# Patient Record
Sex: Male | Born: 1990 | Race: White | Hispanic: No | Marital: Single | Smoking: Current every day smoker
Health system: Southern US, Community
[De-identification: ages and names within clinical notes are randomized; demographics above are authoritative.]

## PROBLEM LIST (undated history)

## (undated) DIAGNOSIS — J45909 Unspecified asthma, uncomplicated: Secondary | ICD-10-CM

## (undated) DIAGNOSIS — A6920 Lyme disease, unspecified: Secondary | ICD-10-CM

## (undated) DIAGNOSIS — N200 Calculus of kidney: Secondary | ICD-10-CM

## (undated) HISTORY — PX: APPENDECTOMY: SHX54

---

## 2017-11-02 ENCOUNTER — Emergency Department (HOSPITAL_COMMUNITY)
Admission: EM | Admit: 2017-11-02 | Discharge: 2017-11-02 | Disposition: A | Discharge status: Home / Self Care | Payer: Self-pay | Attending: Emergency Medicine | Admitting: Emergency Medicine

## 2017-11-02 ENCOUNTER — Other Ambulatory Visit: Payer: Self-pay

## 2017-11-02 ENCOUNTER — Emergency Department (HOSPITAL_COMMUNITY): Payer: Self-pay

## 2017-11-02 ENCOUNTER — Encounter (HOSPITAL_COMMUNITY): Payer: Self-pay | Admitting: Emergency Medicine

## 2017-11-02 DIAGNOSIS — N201 Calculus of ureter: Secondary | ICD-10-CM | POA: Insufficient documentation

## 2017-11-02 DIAGNOSIS — Z79899 Other long term (current) drug therapy: Secondary | ICD-10-CM | POA: Insufficient documentation

## 2017-11-02 DIAGNOSIS — F17210 Nicotine dependence, cigarettes, uncomplicated: Secondary | ICD-10-CM | POA: Insufficient documentation

## 2017-11-02 HISTORY — DX: Lyme disease, unspecified: A69.20

## 2017-11-02 HISTORY — DX: Calculus of kidney: N20.0

## 2017-11-02 LAB — URINALYSIS, ROUTINE W REFLEX MICROSCOPIC
BILIRUBIN URINE: NEGATIVE
Bacteria, UA: NONE SEEN
GLUCOSE, UA: NEGATIVE mg/dL
KETONES UR: NEGATIVE mg/dL
LEUKOCYTES UA: NEGATIVE
NITRITE: NEGATIVE
PH: 6 (ref 5.0–8.0)
Protein, ur: NEGATIVE mg/dL
SPECIFIC GRAVITY, URINE: 1.011 (ref 1.005–1.030)

## 2017-11-02 LAB — BASIC METABOLIC PANEL
Anion gap: 10 (ref 5–15)
BUN: 9 mg/dL (ref 6–20)
CALCIUM: 9.8 mg/dL (ref 8.9–10.3)
CHLORIDE: 106 mmol/L (ref 98–111)
CO2: 24 mmol/L (ref 22–32)
CREATININE: 1 mg/dL (ref 0.61–1.24)
GFR calc Af Amer: >60 mL/min (ref >60)
GFR calc non Af Amer: >60 mL/min (ref >60)
GLUCOSE: 102 mg/dL — AB (ref 70–99)
Potassium: 3.9 mmol/L (ref 3.5–5.1)
Sodium: 140 mmol/L (ref 135–145)

## 2017-11-02 LAB — CBC
HCT: 46.2 % (ref 39.0–52.0)
HEMOGLOBIN: 15.7 g/dL (ref 13.0–17.0)
MCH: 30.3 pg (ref 26.0–34.0)
MCHC: 34 g/dL (ref 30.0–36.0)
MCV: 89 fL (ref 78.0–100.0)
PLATELETS: 238 10*3/uL (ref 150–400)
RBC: 5.19 MIL/uL (ref 4.22–5.81)
RDW: 12.3 % (ref 11.5–15.5)
WBC: 13.6 10*3/uL — ABNORMAL HIGH (ref 4.0–10.5)

## 2017-11-02 MED ORDER — TAMSULOSIN HCL 0.4 MG PO CAPS: 0.4000 mg | ORAL_CAPSULE | Freq: Every day | ORAL | 0 refills | Status: AC

## 2017-11-02 MED ORDER — OXYCODONE-ACETAMINOPHEN 5-325 MG PO TABS
1.0000 | ORAL_TABLET | Freq: Once | ORAL | Status: AC
  Administered 2017-11-02: 1 via ORAL
  Filled 2017-11-02 (×2): qty 1

## 2017-11-02 MED ORDER — TRAMADOL HCL 50 MG PO TABS: 50.0000 mg | ORAL_TABLET | Freq: Four times a day (QID) | ORAL | 0 refills | Status: DC | PRN

## 2017-11-02 MED ORDER — ONDANSETRON 4 MG PO TBDP: 4.0000 mg | ORAL_TABLET | Freq: Three times a day (TID) | ORAL | 0 refills | Status: DC | PRN

## 2017-11-02 NOTE — Discharge Instructions (Signed)
You were seen in the emergency department for left flank and abdominal pain.  CT scan confirms a 5 mm stone at the junction of the left ureter and bladder, it looks like this is almost passed.  Kidney function and other labs are okay.  For  mild to moderate pain you can take 500 to 1000 mg of acetaminophen (Tylenol) and/or 600 mg of ibuprofen (Advil, Motrin) every 6-8 hours.  For more severe breakthrough pain you can take tramadol as prescribed.  Zofran for nausea.  Flomax typically helps dilate the ureter to help pass stones.  We recommend straining your urine until your symptoms resolve in hopes to collect stone. This stone can be tested by urology.   Return to ER for worsening pain, vomiting, fevers, chills, difficulty voiding urine

## 2017-11-02 NOTE — Medical Student Note (Signed)
[facility]-EMERGENCY DEPT Provider Student Note For educational purposes for Medical, PA and NP students only and not part of the legal medical record.   CSN: [id number] Arrival date & time: 11/02/17  1622     History   Chief Complaint Chief Complaint  Patient presents with  . Flank Pain    HPI Marvin Henry is a 27 y.o. male that presents for left sided flank pain that started suddenly today while he was in his car. Patient reports pain radiating down to his groin. Pain was initially 10/10 in severity. He urinated while in the ED and noted it was painful to urinate, however he felt relief following urination. Patient reports he is still having flank pain, but it is no 4/10 in severity. He denies any fevers, chills, nausea, vomiting, diarrhea, penile discharge, or swelling of penis or scrotum. Patient has history of kidney stones in the past. He reports the sensation is similar to what he experienced before.   HPI  Past Medical History:  Diagnosis Date  . Lyme disease   . Nephrolithiasis     There are no active problems to display for this patient.   Past Surgical History:  Procedure Laterality Date  . APPENDECTOMY         Home Medications    Prior to Admission medications   Medication Sig Start Date End Date Taking? Authorizing Provider  VENTOLIN HFA 108 (90 Base) MCG/ACT inhaler Inhale 2 puffs into the lungs every 4 (four) hours as needed for wheezing. 08/20/17  Yes [provider]    Family History No family history on file.  Social History Social History   Tobacco Use  . Smoking status: Current Every Day Smoker    Packs/day: 1.00    Types: Cigarettes  . Smokeless tobacco: Never Used  Substance Use Topics  . Alcohol use: Yes    Comment: occ  . Drug use: Never    Comment: CBD oil     Allergies   Sulfamethoxazole   Review of Systems Review of Systems  Constitutional: Negative for chills and fever.  HENT: Negative.   Eyes: Negative.     Respiratory: Negative for shortness of breath.   Cardiovascular: Negative for chest pain and palpitations.  Gastrointestinal: Negative for constipation, diarrhea, nausea and vomiting.  Genitourinary: Positive for flank pain. Negative for discharge, hematuria, penile swelling and scrotal swelling.  Neurological: Negative.   Hematological: Negative.   Psychiatric/Behavioral: Negative.      Physical Exam Updated Vital Signs BP 121/86 (BP Location: Left Arm)   Pulse 65   Temp 98.5 F (36.9 C) (Oral)   Resp 18   Ht 5\' 7"  (1.702 m)   Wt 66.7 kg   SpO2 100%   BMI 23.02 kg/m   Physical Exam  Constitutional: He is oriented to person, place, and time. He appears well-developed and well-nourished. No distress.  HENT:  Head: Normocephalic and atraumatic.  Eyes: Pupils are equal, round, and reactive to light. EOM are normal.  Neck: Normal range of motion. Neck supple.  Cardiovascular: Normal rate, regular rhythm, normal heart sounds and intact distal pulses.  Pulmonary/Chest: Effort normal and breath sounds normal.  Abdominal: Soft. Bowel sounds are normal. He exhibits no distension and no mass. There is tenderness (mild tenderness to palpation left side). There is no rebound and no guarding.  Musculoskeletal:       Thoracic back: He exhibits tenderness (mild tenderness left flank).  Neurological: He is alert and oriented to person, place, and time.  Skin: Skin is warm and dry.  Psychiatric: He has a normal mood and affect.     ED Treatments / Results  Labs (all labs ordered are listed, but only abnormal results are displayed) Labs Reviewed  URINALYSIS, ROUTINE W REFLEX MICROSCOPIC - Abnormal; Notable for the following components:      Result Value   Hgb urine dipstick LARGE (*)    All other components within normal limits  BASIC METABOLIC PANEL - Abnormal; Notable for the following components:   Glucose, Bld 102 (*)    All other components within normal limits  CBC -  Abnormal; Notable for the following components:   WBC 13.6 (*)    All other components within normal limits  URINE CULTURE    EKG None Radiology Ct Renal Stone Study  Result Date: 11/02/2017 CLINICAL DATA:  27 year old male with acute LEFT flank and abdominal pain today. EXAM: CT ABDOMEN AND PELVIS WITHOUT CONTRAST TECHNIQUE: Multidetector CT imaging of the abdomen and pelvis was performed following the standard protocol without IV contrast. COMPARISON:  None. FINDINGS: Please note that parenchymal abnormalities may be missed without intravenous contrast. Lower chest: No acute abnormality. Hepatobiliary: The liver and gallbladder are unremarkable. No biliary dilatation. Pancreas: Unremarkable Spleen: Unremarkable Adrenals/Urinary Tract: A punctate distal LEFT ureteral calculus (5 mm above the LEFT UVJ) is noted. No hydronephrosis identified. Punctate nonobstructing calculi within the UPPER RIGHT kidney and UPPER LEFT kidney noted. The adrenal glands and bladder are unremarkable. Stomach/Bowel: Stomach is within normal limits. No evidence of bowel wall thickening, distention, or inflammatory changes. Vascular/Lymphatic: No significant vascular findings are present. No enlarged abdominal or pelvic lymph nodes. Reproductive: Prostate is unremarkable. Other: No ascites, focal collection or pneumoperitoneum. Musculoskeletal: No acute or significant osseous findings. IMPRESSION: 1. Punctate distal LEFT ureteral calculus (just above the LEFT UVJ) without hydronephrosis. 2. Punctate nonobstructing bilateral renal calculi Electronically Signed   By: Harmon Pier M.D.   On: 11/02/2017 21:24    Procedures Procedures (including critical care time)  Medications Ordered in ED Medications  oxyCODONE-acetaminophen (PERCOCET/ROXICET) 5-325 MG per tablet 1 tablet (1 tablet Oral Given 11/02/17 2153)     Initial Impression / Assessment and Plan / ED Course  I have reviewed the triage vital signs and the nursing  notes.  Pertinent labs & imaging results that were available during my care of the patient were reviewed by me and considered in my medical decision making (see chart for details).    Patient is a 27 year old male who presents to the Emergency Department with left flank pain. Patient reported pain with urination while in the ED and slight relief of pain following urination. Blood work ordered. Kidney function normal. Patient declined Percocet initially while in the ED.  Renal CT scan showed Punctate distal LEFT ureteral calculus (just above the LEFT UVJ) without hydronephrosis and Punctate nonobstructing bilateral renal calculi.  Patient hemodynamically stable and able to tolerate foods/medications by mouth. Patient discharged with Zofran, Flomax, and Tramadol. Patient had rash as a child after taking Sulfamethoxazole. Discussed possible allergic reaction to Flomax with sulfa allergies. Advised patient return precautions if he experienced any adverse effects when taking Flomax. Patient stated his understanding.   Patient discharged in stable condition.   Final Clinical Impressions(s) / ED Diagnoses   Final diagnoses:  Calculus of ureter    New Prescriptions Discharge Medication List as of 11/02/2017 10:08 PM    START taking these medications   Details  ondansetron (ZOFRAN ODT) 4 MG disintegrating tablet  Take 1 tablet (4 mg total) by mouth every 8 (eight) hours as needed for nausea or vomiting., Starting Mon 11/02/2017, Print    tamsulosin (FLOMAX) 0.4 MG CAPS capsule Take 1 capsule (0.4 mg total) by mouth daily for 10 days., Starting Mon 11/02/2017, Until Thu 11/12/2017, Print    traMADol (ULTRAM) 50 MG tablet Take 1 tablet (50 mg total) by mouth every 6 (six) hours as needed for up to 3 days., Starting Mon 11/02/2017, Until Thu 11/05/2017, Print

## 2017-11-02 NOTE — ED Notes (Signed)
Pt stable, ambulatory, states understanding of discharge instructions 

## 2017-11-02 NOTE — ED Provider Notes (Signed)
[facility] EMERGENCY DEPARTMENT Provider Note   CSN: [id number] Arrival date & time: 11/02/17  1622     History   Chief Complaint Chief Complaint  Patient presents with  . Flank Pain    HPI Marvin Henry is a 27 y.o. 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  Pain was sudden, severe, 10/10.  Pain radiating down to his left abdomen and left lower quadrant.  Pain felt similar to previous kidney stones.  He urinated while in the ED and thinks that he may have passed a stone, states that urination was very painful but afterwards he felt better.  However, states that he still having intermittent flank pain however not as severe, thinks he may still be passing a stone.  Thinks riding on the truck may have moved or dislodged the stone.  He denies fevers, chills, nausea, vomiting, changes in bowel movements, penile discharge, swelling of penis or scrotum.  No interventions PTA.  No alleviating or aggravating factors.  Has never required surgical intervention for kidney stones.  HPI  Past Medical History:  Diagnosis Date  . Lyme disease   . Nephrolithiasis     There are no active problems to display for this patient.   Past Surgical History:  Procedure Laterality Date  . APPENDECTOMY          Home Medications    Prior to Admission medications   Medication Sig Start Date End Date Taking? Authorizing Provider  VENTOLIN HFA 108 (90 Base) MCG/ACT inhaler Inhale 2 puffs into the lungs every 4 (four) hours as needed for wheezing. 08/20/17  Yes [provider]  ondansetron (ZOFRAN ODT) 4 MG disintegrating tablet Take 1 tablet (4 mg total) by mouth every 8 (eight) hours as needed for nausea or vomiting. 11/02/17   Liberty Handy, PA-C  tamsulosin (FLOMAX) 0.4 MG CAPS capsule Take 1 capsule (0.4 mg total) by mouth daily for 10 days. 11/02/17 11/12/17  Liberty Handy, PA-C    traMADol (ULTRAM) 50 MG tablet Take 1 tablet (50 mg total) by mouth every 6 (six) hours as needed for up to 3 days. 11/02/17 11/05/17  Liberty Handy, PA-C    Family History No family history on file.  Social History Social History   Tobacco Use  . Smoking status: Current Every Day Smoker    Packs/day: 1.00    Types: Cigarettes  . Smokeless tobacco: Never Used  Substance Use Topics  . Alcohol use: Yes    Comment: occ  . Drug use: Never    Comment: CBD oil     Allergies   Sulfamethoxazole   Review of Systems Review of Systems  Genitourinary: Positive for difficulty urinating and flank pain.  All other systems reviewed and are negative.    Physical Exam Updated Vital Signs BP 126/85   Pulse 68   Temp 98.7 F (37.1 C) (Oral)   Resp 18   Ht 5\' 7"  (1.702 m)   Wt 66.7 kg (147 lb)   SpO2 100%   BMI 23.02 kg/m   Physical Exam  Constitutional: He is oriented to person, place, and time. He appears well-developed and well-nourished. No distress.  Non toxic. Sitting in hall bed.   HENT:  Head: Normocephalic and atraumatic.  Nose: Nose normal.  Moist mucous membranes   Eyes: Pupils are equal, round, and reactive to light. Conjunctivae and EOM are normal.  Neck: Normal range of motion.  Cardiovascular: Normal rate, regular rhythm and normal heart sounds.  Pulmonary/Chest: Effort normal and breath sounds normal.  Abdominal: Soft. Bowel sounds are normal. There is tenderness.  Very mild left lower CVA tenderness.  Mild tenderness to left mid abdomen and left lower quadrant.  No guarding, rigidity, rebound.  No suprapubic tenderness.  No distention.  Active bowel sounds.  Negative Murphy's, McBurney's.  Musculoskeletal: Normal range of motion.  Neurological: He is alert and oriented to person, place, and time.  Skin: Skin is warm and dry. Capillary refill takes less than 2 seconds.  Psychiatric: He has a normal mood and affect. His behavior is normal. Judgment and  thought content normal.  Nursing note and vitals reviewed.    ED Treatments / Results  Labs (all labs ordered are listed, but only abnormal results are displayed) Labs Reviewed  URINALYSIS, ROUTINE W REFLEX MICROSCOPIC - Abnormal; Notable for the following components:      Result Value   Hgb urine dipstick LARGE (*)    All other components within normal limits  BASIC METABOLIC PANEL - Abnormal; Notable for the following components:   Glucose, Bld 102 (*)    All other components within normal limits  CBC - Abnormal; Notable for the following components:   WBC 13.6 (*)    All other components within normal limits  URINE CULTURE    EKG None  Radiology Ct Renal Stone Study  Result Date: 11/02/2017 CLINICAL DATA:  27 year old male with acute LEFT flank and abdominal pain today. EXAM: CT ABDOMEN AND PELVIS WITHOUT CONTRAST TECHNIQUE: Multidetector CT imaging of the abdomen and pelvis was performed following the standard protocol without IV contrast. COMPARISON:  None. FINDINGS: Please note that parenchymal abnormalities may be missed without intravenous contrast. Lower chest: No acute abnormality. Hepatobiliary: The liver and gallbladder are unremarkable. No biliary dilatation. Pancreas: Unremarkable Spleen: Unremarkable Adrenals/Urinary Tract: A punctate distal LEFT ureteral calculus (5 mm above the LEFT UVJ) is noted. No hydronephrosis identified. Punctate nonobstructing calculi within the UPPER RIGHT kidney and UPPER LEFT kidney noted. The adrenal glands and bladder are unremarkable. Stomach/Bowel: Stomach is within normal limits. No evidence of bowel wall thickening, distention, or inflammatory changes. Vascular/Lymphatic: No significant vascular findings are present. No enlarged abdominal or pelvic lymph nodes. Reproductive: Prostate is unremarkable. Other: No ascites, focal collection or pneumoperitoneum. Musculoskeletal: No acute or significant osseous findings. IMPRESSION: 1. Punctate  distal LEFT ureteral calculus (just above the LEFT UVJ) without hydronephrosis. 2. Punctate nonobstructing bilateral renal calculi Electronically Signed   By: Harmon PierJeffrey  Hu M.D.   On: 11/02/2017 21:24    Procedures Procedures (including critical care time)  Medications Ordered in ED Medications  oxyCODONE-acetaminophen (PERCOCET/ROXICET) 5-325 MG per tablet 1 tablet (1 tablet Oral Given 11/02/17 2153)     Initial Impression / Assessment and Plan / ED Course  I have reviewed the triage vital signs and the nursing notes.  Pertinent labs & imaging results that were available during my care of the patient were reviewed by me and considered in my medical decision making (see chart for details).    Differential diagnosis includes kidney or ureteral stone.  Less likely left-sided diverticulitis as he has no fevers, chills, nausea, vomiting, diarrhea or history of the same.  Less likely testicular torsion.  Urine with hematuria but no signs of infection.  CT renal shows 5 mm distal ureter stone above the UVJ this is likely worse symptoms are initiating from.  He may have also  passed a stone previously.  Will discharge with symptomatic management and Flomax.  He does have documented allergy of rash with sulfa, I called pharmacist in regards to this and they think cross-reactivity with Flomax is very low risk.  Patient never had angioedema with sulfa during childhood.  I discussed potential cross-reactivity with Flomax and provided instructions on how to take it and return precautions.  He verbalized understanding.  Final Clinical Impressions(s) / ED Diagnoses   Final diagnoses:  Calculus of ureter    ED Discharge Orders        Ordered    traMADol (ULTRAM) 50 MG tablet  Every 6 hours PRN     11/02/17 2155    ondansetron (ZOFRAN ODT) 4 MG disintegrating tablet  Every 8 hours PRN     11/02/17 2155    tamsulosin (FLOMAX) 0.4 MG CAPS capsule  Daily     11/02/17 2208       Liberty Handy,  PA-C 11/03/17 1208    Arby Barrette, MD 11/05/17 2325

## 2017-11-02 NOTE — ED Notes (Signed)
Patient transported to CT 

## 2017-11-02 NOTE — ED Triage Notes (Signed)
Patient to ED c/o sudden onset L flank pain about an hour ago, cramping that radiated down L side. Patient reports urgency and then when he got to the ED, he urinated and felt some relief (reports pain was 10/10 and is now 4/10). Hx kidney stones. In no apparent distress at this time.

## 2017-11-04 ENCOUNTER — Other Ambulatory Visit: Payer: Self-pay

## 2017-11-04 ENCOUNTER — Encounter (HOSPITAL_COMMUNITY): Payer: Self-pay | Admitting: *Deleted

## 2017-11-04 ENCOUNTER — Emergency Department (HOSPITAL_COMMUNITY)
Admission: EM | Admit: 2017-11-04 | Discharge: 2017-11-04 | Disposition: A | Discharge status: Home / Self Care | Payer: 59 | Attending: Emergency Medicine | Admitting: Emergency Medicine

## 2017-11-04 DIAGNOSIS — N23 Unspecified renal colic: Secondary | ICD-10-CM | POA: Diagnosis not present

## 2017-11-04 DIAGNOSIS — R109 Unspecified abdominal pain: Secondary | ICD-10-CM | POA: Diagnosis present

## 2017-11-04 DIAGNOSIS — F17210 Nicotine dependence, cigarettes, uncomplicated: Secondary | ICD-10-CM | POA: Diagnosis not present

## 2017-11-04 MED ORDER — HYDROMORPHONE HCL 1 MG/ML IJ SOLN
1.0000 mg | Freq: Once | INTRAMUSCULAR | Status: AC
Start: 2017-11-04 — End: 2017-11-04
  Administered 2017-11-04: 1 mg via INTRAVENOUS
  Filled 2017-11-04: qty 1

## 2017-11-04 MED ORDER — KETOROLAC TROMETHAMINE 30 MG/ML IJ SOLN
30.0000 mg | Freq: Once | INTRAMUSCULAR | Status: AC
  Administered 2017-11-04: 30 mg via INTRAVENOUS
  Filled 2017-11-04: qty 1

## 2017-11-04 MED ORDER — OXYCODONE-ACETAMINOPHEN 5-325 MG PO TABS: 1.0000 | ORAL_TABLET | Freq: Once | ORAL | Status: DC

## 2017-11-04 MED ORDER — OXYCODONE-ACETAMINOPHEN 5-325 MG PO TABS: 1.0000 | ORAL_TABLET | ORAL | 0 refills | Status: DC | PRN

## 2017-11-04 MED ORDER — ONDANSETRON HCL 4 MG/2ML IJ SOLN
4.0000 mg | Freq: Once | INTRAMUSCULAR | Status: AC
  Administered 2017-11-04: 4 mg via INTRAVENOUS
  Filled 2017-11-04: qty 2

## 2017-11-04 NOTE — ED Notes (Signed)
Patient not changed into gown due to severe pain with and without movement.

## 2017-11-04 NOTE — ED Notes (Signed)
Reports 10/10 pain - skin tan, warm, clammy; appears in acute distress; recently d/x'd with kidney stones - on flomax and tramadol at home - reports pain increased acutely at 0900 this am

## 2017-11-04 NOTE — ED Notes (Signed)
MD at bedside. 

## 2017-11-04 NOTE — ED Notes (Signed)
Cranberry juice given per pt request; resting quietly on stretcher - states feeling "much better"

## 2017-11-04 NOTE — ED Triage Notes (Signed)
PT tx here on 7/1 for tx of kidney stones.  Given 150 mcg fentanyl and 4 of zofran en-route.  VS stable.  140/71, hr 80, 98% RA.  Pt appears in great pain.

## 2017-11-04 NOTE — ED Notes (Signed)
Family at bedside. 

## 2017-11-04 NOTE — ED Provider Notes (Signed)
[facility] Provider Note   CSN: [id number] Arrival date & time: 11/04/17  1154     History   Chief Complaint Chief Complaint  Patient presents with  . Flank Pain    HPI Marvin Henry is a 27 y.o. male.  HPI   Complains of left flank pain radiating to the left lower abdomen, worsening despite treatment with tramadol and Flomax.  He was diagnosed with a punctate distal left ureteral stone, 2 days ago, and was referred to urology, who is not seen yet.  No prior episodes of renal stones.  No nausea, vomiting, fever, chills, weakness or dizziness.  There are no other no modifying factors.  Past Medical History:  Diagnosis Date  . Lyme disease   . Nephrolithiasis     There are no active problems to display for this patient.   Past Surgical History:  Procedure Laterality Date  . APPENDECTOMY          Home Medications    Prior to Admission medications   Medication Sig Start Date End Date Taking? Authorizing Provider  ondansetron (ZOFRAN ODT) 4 MG disintegrating tablet Take 1 tablet (4 mg total) by mouth every 8 (eight) hours as needed for nausea or vomiting. 11/02/17   Liberty HandyGibbons, Claudia J, PA-C  oxyCODONE-acetaminophen (PERCOCET/ROXICET) 5-325 MG tablet Take 1 tablet by mouth every 4 (four) hours as needed for moderate pain or severe pain. 11/04/17   Mancel BaleWentz, Mishelle Hassan, MD  tamsulosin (FLOMAX) 0.4 MG CAPS capsule Take 1 capsule (0.4 mg total) by mouth daily for 10 days. 11/02/17 11/12/17  Liberty HandyGibbons, Claudia J, PA-C  VENTOLIN HFA 108 (90 Base) MCG/ACT inhaler Inhale 2 puffs into the lungs every 4 (four) hours as needed for wheezing. 08/20/17   [provider]    Family History No family history on file.  Social History Social History   Tobacco Use  . Smoking status: Current Every Day Smoker    Packs/day: 1.00    Types: Cigarettes  . Smokeless tobacco: Never Used  Substance Use Topics  . Alcohol use: Yes    Comment: occ  . Drug  use: Never    Comment: CBD oil     Allergies   Sulfamethoxazole   Review of Systems Review of Systems  All other systems reviewed and are negative.    Physical Exam Updated Vital Signs BP 114/62   Pulse 75   Temp 98 F (36.7 C) (Oral)   Resp 17   Ht 5\' 7"  (1.702 m)   Wt 66.7 kg (147 lb)   SpO2 95%   BMI 23.02 kg/m   Physical Exam  Constitutional: He is oriented to person, place, and time. He appears well-developed and well-nourished.  HENT:  Head: Normocephalic and atraumatic.  Right Ear: External ear normal.  Left Ear: External ear normal.  Eyes: Pupils are equal, round, and reactive to light. Conjunctivae and EOM are normal.  Neck: Normal range of motion and phonation normal. Neck supple.  Cardiovascular: Normal rate and regular rhythm.  Pulmonary/Chest: Effort normal. He exhibits no bony tenderness.  Musculoskeletal: Normal range of motion.  Neurological: He is alert and oriented to person, place, and time. No cranial nerve deficit or sensory deficit. He exhibits normal muscle tone. Coordination normal.  Skin: Skin is warm, dry and intact.  Psychiatric: He has a normal mood and affect. His behavior is normal. Judgment and thought content normal.  Nursing note and vitals reviewed.    ED Treatments / Results  Labs (  all labs ordered are listed, but only abnormal results are displayed) Labs Reviewed - No data to display  EKG None  Radiology Ct Renal Stone Study  Result Date: 11/02/2017 CLINICAL DATA:  27 year old male with acute LEFT flank and abdominal pain today. EXAM: CT ABDOMEN AND PELVIS WITHOUT CONTRAST TECHNIQUE: Multidetector CT imaging of the abdomen and pelvis was performed following the standard protocol without IV contrast. COMPARISON:  None. FINDINGS: Please note that parenchymal abnormalities may be missed without intravenous contrast. Lower chest: No acute abnormality. Hepatobiliary: The liver and gallbladder are unremarkable. No biliary  dilatation. Pancreas: Unremarkable Spleen: Unremarkable Adrenals/Urinary Tract: A punctate distal LEFT ureteral calculus (5 mm above the LEFT UVJ) is noted. No hydronephrosis identified. Punctate nonobstructing calculi within the UPPER RIGHT kidney and UPPER LEFT kidney noted. The adrenal glands and bladder are unremarkable. Stomach/Bowel: Stomach is within normal limits. No evidence of bowel wall thickening, distention, or inflammatory changes. Vascular/Lymphatic: No significant vascular findings are present. No enlarged abdominal or pelvic lymph nodes. Reproductive: Prostate is unremarkable. Other: No ascites, focal collection or pneumoperitoneum. Musculoskeletal: No acute or significant osseous findings. IMPRESSION: 1. Punctate distal LEFT ureteral calculus (just above the LEFT UVJ) without hydronephrosis. 2. Punctate nonobstructing bilateral renal calculi Electronically Signed   By: Harmon Pier M.D.   On: 11/02/2017 21:24    Procedures Procedures (including critical care time)  Medications Ordered in ED Medications  oxyCODONE-acetaminophen (PERCOCET/ROXICET) 5-325 MG per tablet 1 tablet (has no administration in time range)  HYDROmorphone (DILAUDID) injection 1 mg (1 mg Intravenous Given 11/04/17 1226)  ketorolac (TORADOL) 30 MG/ML injection 30 mg (30 mg Intravenous Given 11/04/17 1229)  ondansetron (ZOFRAN) injection 4 mg (4 mg Intravenous Given 11/04/17 1228)     Initial Impression / Assessment and Plan / ED Course  I have reviewed the triage vital signs and the nursing notes.  Pertinent labs & imaging results that were available during my care of the patient were reviewed by me and considered in my medical decision making (see chart for details).     Patient Vitals for the past 24 hrs:  BP Temp Temp src Pulse Resp SpO2 Height Weight  11/04/17 1300 114/62 - - 75 17 95 % - -  11/04/17 1230 120/78 - - 73 - 99 % - -  11/04/17 1207 - - - - - - 5\' 7"  (1.702 m) 66.7 kg (147 lb)  11/04/17 1204  117/73 98 F (36.7 C) Oral 89 (!) 22 100 % - -    2:16 PM Reevaluation with update and discussion. After initial assessment and treatment, an updated evaluation reveals states his pain was initially almost gone now is returning somewhat.  Oral Percocet ordered.  Findings discussed with patient and family members, all questions answered. Mancel Bale   Medical Decision Making: Small left distal ureteral stone, likely still present, with obstructive symptoms, without likely acute renal failure.  Doubt serious bacterial infection, metabolic instability or impending vascular collapse  CRITICAL CARE-no Performed by: Mancel Bale   Nursing Notes Reviewed/ Care Coordinated Applicable Imaging Reviewed Interpretation of Laboratory Data incorporated into ED treatment  The patient appears reasonably screened and/or stabilized for discharge and I doubt any other medical condition or other [facility] requiring further screening, evaluation, or treatment in the ED at this time prior to discharge.  Plan: Home Medications-hold tramadol for now, continue Flomax; Home Treatments-oral fluids, strain urine; return here if the recommended treatment, does not improve the symptoms; Recommended follow up-urology follow-up 1 week and as needed  Final Clinical Impressions(s) / ED Diagnoses   Final diagnoses:  Ureteral colic    ED Discharge Orders        Ordered    oxyCODONE-acetaminophen (PERCOCET/ROXICET) 5-325 MG tablet  Every 4 hours PRN     11/04/17 1419       Mancel Bale, MD 11/04/17 1419

## 2017-11-08 ENCOUNTER — Emergency Department (HOSPITAL_COMMUNITY)
Admission: EM | Admit: 2017-11-08 | Discharge: 2017-11-08 | Disposition: A | Discharge status: Home / Self Care | Payer: 59 | Attending: Emergency Medicine | Admitting: Emergency Medicine

## 2017-11-08 ENCOUNTER — Emergency Department (HOSPITAL_COMMUNITY): Payer: 59

## 2017-11-08 ENCOUNTER — Encounter (HOSPITAL_COMMUNITY): Payer: Self-pay | Admitting: Emergency Medicine

## 2017-11-08 ENCOUNTER — Emergency Department (HOSPITAL_COMMUNITY): Payer: 59 | Admitting: Anesthesiology

## 2017-11-08 ENCOUNTER — Encounter (HOSPITAL_COMMUNITY)
Admission: EM | Disposition: A | Discharge status: Home / Self Care | Payer: Self-pay | Source: Home / Self Care | Attending: Emergency Medicine

## 2017-11-08 DIAGNOSIS — N201 Calculus of ureter: Secondary | ICD-10-CM | POA: Insufficient documentation

## 2017-11-08 DIAGNOSIS — F17210 Nicotine dependence, cigarettes, uncomplicated: Secondary | ICD-10-CM | POA: Diagnosis not present

## 2017-11-08 DIAGNOSIS — N135 Crossing vessel and stricture of ureter without hydronephrosis: Secondary | ICD-10-CM | POA: Insufficient documentation

## 2017-11-08 DIAGNOSIS — Z79899 Other long term (current) drug therapy: Secondary | ICD-10-CM | POA: Insufficient documentation

## 2017-11-08 HISTORY — PX: CYSTOSCOPY WITH RETROGRADE PYELOGRAM, URETEROSCOPY AND STENT PLACEMENT: SHX5789

## 2017-11-08 LAB — URINALYSIS, ROUTINE W REFLEX MICROSCOPIC
BACTERIA UA: NONE SEEN
BILIRUBIN URINE: NEGATIVE
Glucose, UA: NEGATIVE mg/dL
Ketones, ur: 80 mg/dL — AB
LEUKOCYTES UA: NEGATIVE
Nitrite: NEGATIVE
PH: 8 (ref 5.0–8.0)
Protein, ur: NEGATIVE mg/dL
RBC / HPF: >50 RBC/hpf — ABNORMAL HIGH (ref 0–5)
Specific Gravity, Urine: 1.019 (ref 1.005–1.030)

## 2017-11-08 LAB — BASIC METABOLIC PANEL
ANION GAP: 12 (ref 5–15)
BUN: 11 mg/dL (ref 6–20)
CO2: 20 mmol/L — AB (ref 22–32)
Calcium: 9.1 mg/dL (ref 8.9–10.3)
Chloride: 108 mmol/L (ref 98–111)
Creatinine, Ser: 1.43 mg/dL — ABNORMAL HIGH (ref 0.61–1.24)
GFR calc Af Amer: >60 mL/min (ref >60)
GFR calc non Af Amer: >60 mL/min (ref >60)
GLUCOSE: 102 mg/dL — AB (ref 70–99)
POTASSIUM: 3.8 mmol/L (ref 3.5–5.1)
Sodium: 140 mmol/L (ref 135–145)

## 2017-11-08 LAB — CBC
HEMATOCRIT: 41.7 % (ref 39.0–52.0)
Hemoglobin: 14.2 g/dL (ref 13.0–17.0)
MCH: 30.1 pg (ref 26.0–34.0)
MCHC: 34.1 g/dL (ref 30.0–36.0)
MCV: 88.3 fL (ref 78.0–100.0)
Platelets: 232 10*3/uL (ref 150–400)
RBC: 4.72 MIL/uL (ref 4.22–5.81)
RDW: 12.2 % (ref 11.5–15.5)
WBC: 18.3 10*3/uL — AB (ref 4.0–10.5)

## 2017-11-08 SURGERY — CYSTOURETEROSCOPY, WITH RETROGRADE PYELOGRAM AND STENT INSERTION
Anesthesia: General | Site: Ureter | Laterality: Bilateral

## 2017-11-08 MED ORDER — OXYCODONE-ACETAMINOPHEN 5-325 MG PO TABS
1.0000 | ORAL_TABLET | Freq: Once | ORAL | Status: AC
  Administered 2017-11-08: 1 via ORAL
  Filled 2017-11-08: qty 1

## 2017-11-08 MED ORDER — OXYCODONE-ACETAMINOPHEN 5-325 MG PO TABS: 1.0000 | ORAL_TABLET | ORAL | 0 refills | Status: DC | PRN

## 2017-11-08 MED ORDER — SODIUM CHLORIDE 0.9 % IR SOLN
Status: DC | PRN
  Administered 2017-11-08: 1000 mL via INTRAVESICAL
  Administered 2017-11-08: 3000 mL via INTRAVESICAL

## 2017-11-08 MED ORDER — FENTANYL CITRATE (PF) 100 MCG/2ML IJ SOLN
INTRAMUSCULAR | Status: AC
  Filled 2017-11-08: qty 2

## 2017-11-08 MED ORDER — SUCCINYLCHOLINE CHLORIDE 200 MG/10ML IV SOSY
PREFILLED_SYRINGE | INTRAVENOUS | Status: DC | PRN
  Administered 2017-11-08: 100 mg via INTRAVENOUS

## 2017-11-08 MED ORDER — ROCURONIUM BROMIDE 100 MG/10ML IV SOLN
INTRAVENOUS | Status: AC
  Filled 2017-11-08: qty 1

## 2017-11-08 MED ORDER — SODIUM CHLORIDE 0.9 % IV BOLUS: 1000.0000 mL | Freq: Once | INTRAVENOUS | Status: DC

## 2017-11-08 MED ORDER — CEFAZOLIN SODIUM-DEXTROSE 2-3 GM-%(50ML) IV SOLR
INTRAVENOUS | Status: DC | PRN
  Administered 2017-11-08: 2 g via INTRAVENOUS

## 2017-11-08 MED ORDER — ONDANSETRON HCL 4 MG/2ML IJ SOLN
4.0000 mg | Freq: Once | INTRAMUSCULAR | Status: AC
  Administered 2017-11-08: 4 mg via INTRAVENOUS
  Filled 2017-11-08: qty 2

## 2017-11-08 MED ORDER — MIDAZOLAM HCL 5 MG/5ML IJ SOLN
INTRAMUSCULAR | Status: DC | PRN
  Administered 2017-11-08: 2 mg via INTRAVENOUS

## 2017-11-08 MED ORDER — HYDROMORPHONE HCL 1 MG/ML IJ SOLN
0.5000 mg | Freq: Once | INTRAMUSCULAR | Status: AC
  Administered 2017-11-08: 0.5 mg via INTRAVENOUS
  Filled 2017-11-08: qty 1

## 2017-11-08 MED ORDER — ONDANSETRON HCL 4 MG/2ML IJ SOLN
4.00 | INTRAMUSCULAR | Status: DC
Start: ? — End: 2017-11-08

## 2017-11-08 MED ORDER — BELLADONNA ALKALOIDS-OPIUM 16.2-30 MG RE SUPP
RECTAL | Status: AC
  Filled 2017-11-08: qty 1

## 2017-11-08 MED ORDER — DEXAMETHASONE SODIUM PHOSPHATE 4 MG/ML IJ SOLN
INTRAMUSCULAR | Status: DC | PRN
  Administered 2017-11-08: 10 mg via INTRAVENOUS

## 2017-11-08 MED ORDER — PROMETHAZINE HCL 25 MG/ML IJ SOLN: 6.2500 mg | INTRAMUSCULAR | Status: DC | PRN

## 2017-11-08 MED ORDER — LIDOCAINE 2% (20 MG/ML) 5 ML SYRINGE
INTRAMUSCULAR | Status: DC | PRN
  Administered 2017-11-08: 60 mg via INTRAVENOUS

## 2017-11-08 MED ORDER — LIDOCAINE HCL URETHRAL/MUCOSAL 2 % EX GEL
CUTANEOUS | Status: DC | PRN
  Administered 2017-11-08: 1 via URETHRAL

## 2017-11-08 MED ORDER — CEFAZOLIN SODIUM-DEXTROSE 2-4 GM/100ML-% IV SOLN
2.0000 g | INTRAVENOUS | Status: AC
  Administered 2017-11-08: 2 g via INTRAVENOUS

## 2017-11-08 MED ORDER — PROPOFOL 10 MG/ML IV BOLUS
INTRAVENOUS | Status: AC
  Filled 2017-11-08: qty 20

## 2017-11-08 MED ORDER — SUCCINYLCHOLINE CHLORIDE 200 MG/10ML IV SOSY
PREFILLED_SYRINGE | INTRAVENOUS | Status: AC
  Filled 2017-11-08: qty 10

## 2017-11-08 MED ORDER — ROCURONIUM BROMIDE 10 MG/ML (PF) SYRINGE
PREFILLED_SYRINGE | INTRAVENOUS | Status: DC | PRN
  Administered 2017-11-08: 40 mg via INTRAVENOUS

## 2017-11-08 MED ORDER — PROPOFOL 10 MG/ML IV BOLUS
INTRAVENOUS | Status: DC | PRN
  Administered 2017-11-08: 150 mg via INTRAVENOUS

## 2017-11-08 MED ORDER — ONDANSETRON HCL 4 MG/2ML IJ SOLN
INTRAMUSCULAR | Status: DC | PRN
  Administered 2017-11-08: 4 mg via INTRAVENOUS

## 2017-11-08 MED ORDER — HYDROMORPHONE HCL 1 MG/ML IJ SOLN
1.00 | INTRAMUSCULAR | Status: DC
Start: ? — End: 2017-11-08

## 2017-11-08 MED ORDER — FENTANYL CITRATE (PF) 100 MCG/2ML IJ SOLN
INTRAMUSCULAR | Status: DC | PRN
  Administered 2017-11-08 (×2): 50 ug via INTRAVENOUS

## 2017-11-08 MED ORDER — SUGAMMADEX SODIUM 200 MG/2ML IV SOLN
INTRAVENOUS | Status: DC | PRN
  Administered 2017-11-08: 200 mg via INTRAVENOUS

## 2017-11-08 MED ORDER — PHENAZOPYRIDINE HCL 200 MG PO TABS: 200.0000 mg | ORAL_TABLET | Freq: Three times a day (TID) | ORAL | 1 refills | Status: AC | PRN

## 2017-11-08 MED ORDER — ACETAMINOPHEN 10 MG/ML IV SOLN
INTRAVENOUS | Status: AC
  Filled 2017-11-08: qty 100

## 2017-11-08 MED ORDER — LIDOCAINE 2% (20 MG/ML) 5 ML SYRINGE
INTRAMUSCULAR | Status: AC
  Filled 2017-11-08: qty 5

## 2017-11-08 MED ORDER — KETOROLAC TROMETHAMINE 30 MG/ML IJ SOLN
30.0000 mg | Freq: Once | INTRAMUSCULAR | Status: AC
  Administered 2017-11-08: 30 mg via INTRAVENOUS
  Filled 2017-11-08: qty 1

## 2017-11-08 MED ORDER — LIDOCAINE HCL URETHRAL/MUCOSAL 2 % EX GEL
CUTANEOUS | Status: AC
  Filled 2017-11-08: qty 5

## 2017-11-08 MED ORDER — FENTANYL CITRATE (PF) 100 MCG/2ML IJ SOLN: 25.0000 ug | INTRAMUSCULAR | Status: DC | PRN

## 2017-11-08 MED ORDER — SODIUM CHLORIDE 0.9 % IV SOLN
INTRAVENOUS | Status: DC
  Administered 2017-11-08: 12:00:00 via INTRAVENOUS

## 2017-11-08 MED ORDER — BELLADONNA ALKALOIDS-OPIUM 16.2-60 MG RE SUPP
RECTAL | Status: DC | PRN
  Administered 2017-11-08: 1 via RECTAL

## 2017-11-08 MED ORDER — ACETAMINOPHEN 10 MG/ML IV SOLN
1000.0000 mg | Freq: Four times a day (QID) | INTRAVENOUS | Status: DC
  Filled 2017-11-08 (×2): qty 100

## 2017-11-08 MED ORDER — MIDAZOLAM HCL 2 MG/2ML IJ SOLN
INTRAMUSCULAR | Status: AC
  Filled 2017-11-08: qty 2

## 2017-11-08 MED ORDER — SODIUM CHLORIDE 0.9 % IV BOLUS
1000.0000 mL | Freq: Once | INTRAVENOUS | Status: AC
  Administered 2017-11-08: 1000 mL via INTRAVENOUS

## 2017-11-08 SURGICAL SUPPLY — 25 items
BAG URO CATCHER STRL LF (MISCELLANEOUS) ×4 IMPLANT
BASKET STONE NCOMPASS (UROLOGICAL SUPPLIES) IMPLANT
CATH INTERMIT  6FR 70CM (CATHETERS) ×4 IMPLANT
CATH URET 5FR 28IN OPEN ENDED (CATHETERS) ×4 IMPLANT
CATH URET DUAL LUMEN 6-10FR 50 (CATHETERS) ×4 IMPLANT
CLOTH BEACON ORANGE TIMEOUT ST (SAFETY) ×4 IMPLANT
COVER FOOTSWITCH UNIV (MISCELLANEOUS) ×4 IMPLANT
COVER SURGICAL LIGHT HANDLE (MISCELLANEOUS) ×4 IMPLANT
EXTRACTOR STONE NITINOL NGAGE (UROLOGICAL SUPPLIES) ×4 IMPLANT
FIBER LASER FLEXIVA 1000 (UROLOGICAL SUPPLIES) IMPLANT
FIBER LASER FLEXIVA 365 (UROLOGICAL SUPPLIES) IMPLANT
FIBER LASER FLEXIVA 550 (UROLOGICAL SUPPLIES) IMPLANT
FIBER LASER TRAC TIP (UROLOGICAL SUPPLIES) ×4 IMPLANT
GLOVE SURG SS PI 8.0 STRL IVOR (GLOVE) ×4 IMPLANT
GOWN STRL REUS W/TWL XL LVL3 (GOWN DISPOSABLE) ×4 IMPLANT
GUIDEWIRE STR DUAL SENSOR (WIRE) ×8 IMPLANT
IV NS 1000ML (IV SOLUTION) ×2
IV NS 1000ML BAXH (IV SOLUTION) ×2 IMPLANT
IV NS IRRIG 3000ML ARTHROMATIC (IV SOLUTION) ×4 IMPLANT
MANIFOLD NEPTUNE II (INSTRUMENTS) ×4 IMPLANT
PACK CYSTO (CUSTOM PROCEDURE TRAY) ×4 IMPLANT
SHEATH URETERAL 12FRX35CM (MISCELLANEOUS) ×4 IMPLANT
TUBING CONNECTING 10 (TUBING) ×3 IMPLANT
TUBING CONNECTING 10' (TUBING) ×1
TUBING UROLOGY SET (TUBING) ×4 IMPLANT

## 2017-11-08 NOTE — H&P (View-Only) (Signed)
Subjective: CC: right flank pain.  Hx: Marvin Henry is a 27 yo WM who I was asked to see in consultation by Rhea Bleacher PA at [facility] ER for ureteral stones and intractable pain.   Had the onset on 7/1 of left flank pain and came to [facility] ER where a CT showed a small left distal stone.   He has subsequently been back to [facility] ER on 7/3 and at [facility] ER on 7/6 with recurrent pain, but yesterday the pain had moved to the right side and a repeat CT on 7/6 showed bilateral small distal stones about 1-12mm in size.  He has small bilateral renal stones as well.  He has had severe nausea and vomiting but no voiding complaints.  He passed a stone about 4 years ago but has no other associated signs or symptoms.   His UA has >50 RBC's.   ROS:  Review of Systems  Constitutional: Negative for chills and fever.  Cardiovascular: Positive for chest pain (following prolonged vomiting.).  Gastrointestinal: Positive for abdominal pain, nausea and vomiting.  Genitourinary: Positive for flank pain.  All other systems reviewed and are negative.   Allergies  Allergen Reactions  . Sulfamethoxazole Rash    Past Medical History:  Diagnosis Date  . Lyme disease   . Nephrolithiasis     Past Surgical History:  Procedure Laterality Date  . APPENDECTOMY      Social History   Socioeconomic History  . Marital status: Single    Spouse name: Not on file  . Number of children: Not on file  . Years of education: Not on file  . Highest education level: Not on file  Occupational History  . Not on file  Social Needs  . Financial resource strain: Not on file  . Food insecurity:    Worry: Not on file    Inability: Not on file  . Transportation needs:    Medical: Not on file    Non-medical: Not on file  Tobacco Use  . Smoking status: Current Every Day Smoker    Packs/day: 1.00    Types: Cigarettes  . Smokeless tobacco: Never Used  Substance and Sexual Activity  . Alcohol use: Yes    Comment: occ  .  Drug use: Yes    Types: Marijuana    Comment: CBD oil  . Sexual activity: Not on file  Lifestyle  . Physical activity:    Days per week: Not on file    Minutes per session: Not on file  . Stress: Not on file  Relationships  . Social connections:    Talks on phone: Not on file    Gets together: Not on file    Attends religious service: Not on file    Active member of club or organization: Not on file    Attends meetings of clubs or organizations: Not on file    Relationship status: Not on file  . Intimate partner violence:    Fear of current or ex partner: Not on file    Emotionally abused: Not on file    Physically abused: Not on file    Forced sexual activity: Not on file  Other Topics Concern  . Not on file  Social History Narrative  . Not on file    History reviewed. No pertinent family history.  Anti-infectives: Anti-infectives (From admission, onward)   None      Current Facility-Administered Medications  Medication Dose Route Frequency Provider Last Rate Last Dose  .  acetaminophen (OFIRMEV) IV 1,000 mg  1,000 mg Intravenous Q6H Renne CriglerGeiple, Joshua, PA-C      . HYDROmorphone (DILAUDID) injection 0.5 mg  0.5 mg Intravenous Once Renne CriglerGeiple, Joshua, PA-C      . sodium chloride 0.9 % bolus 1,000 mL  1,000 mL Intravenous Once Renne CriglerGeiple, Joshua, PA-C       Current Outpatient Medications  Medication Sig Dispense Refill  . ondansetron (ZOFRAN ODT) 4 MG disintegrating tablet Take 1 tablet (4 mg total) by mouth every 8 (eight) hours as needed for nausea or vomiting. 20 tablet 0  . oxyCODONE-acetaminophen (PERCOCET/ROXICET) 5-325 MG tablet Take 1 tablet by mouth every 4 (four) hours as needed for moderate pain or severe pain. 20 tablet 0  . tamsulosin (FLOMAX) 0.4 MG CAPS capsule Take 1 capsule (0.4 mg total) by mouth daily for 10 days. 10 capsule 0  . VENTOLIN HFA 108 (90 Base) MCG/ACT inhaler Inhale 2 puffs into the lungs every 4 (four) hours as needed for wheezing.  0      Objective: Vital signs in last 24 hours: Temp:  [98 F (36.7 C)] 98 F (36.7 C) (07/07 0629) Pulse Rate:  [95-105] 95 (07/07 0705) Resp:  [20-22] 22 (07/07 0705) BP: (108-114)/(44-81) 114/44 (07/07 0705) SpO2:  [98 %-100 %] 100 % (07/07 0705) Weight:  [66.7 kg (147 lb)] 66.7 kg (147 lb) (07/07 0653)  Intake/Output from previous day: No intake/output data recorded. Intake/Output this shift: No intake/output data recorded.   Physical Exam  Constitutional: He is oriented to person, place, and time. He appears well-developed and well-nourished. He appears distressed (in pain).  HENT:  Head: Normocephalic and atraumatic.  Neck: Normal range of motion. Neck supple. No thyromegaly present.  Cardiovascular: Normal rate, regular rhythm and normal heart sounds.  Pulmonary/Chest: Effort normal and breath sounds normal. No respiratory distress.  Abdominal: Soft. There is tenderness (right lower quadrant and flank tenderness.).  Musculoskeletal: Normal range of motion. He exhibits no edema or tenderness.  Lymphadenopathy:    He has no cervical adenopathy.  Neurological: He is alert and oriented to person, place, and time.  Skin: Skin is warm and dry.  Psychiatric:  Mood agitated with pain.  Affect normal.   Vitals reviewed.   Lab Results:  Recent Labs    11/08/17 0719  WBC 18.3*  HGB 14.2  HCT 41.7  PLT 232   BMET Recent Labs    11/08/17 0719  NA 140  K 3.8  CL 108  CO2 20*  GLUCOSE 102*  BUN 11  CREATININE 1.43*  CALCIUM 9.1   PT/INR No results for input(s): LABPROT, INR in the last 72 hours. ABG No results for input(s): PHART, HCO3 in the last 72 hours.  Invalid input(s): PCO2, PO2  Studies/Results: No results found.  I have reviewed his labs and UA.  I have reviewed his CT's from 7/1 and 11/07/17.  I have discussed his case with the ER PA.   Assessment: Bilateral distal ureteral stones with intractable right flank pain and nausea.   AKI with Cr of  1.43.  Small bilateral renal stones.    I discussed the options for therapy including further medical management vs blaterl ureteroscopic stone extraction.   He has elected ureteroscopy so [facility].  I reviewed the risks of bleeding, infection, ureteral injury, need for a stent or secondary procedures, thrombotic events and anesthetic complications.     CC: Rhea BleacherJosh Geiple PA     Marvin PippinJohn Ciji Henry  11/08/2017 [phone]  

## 2017-11-08 NOTE — Discharge Instructions (Signed)

## 2017-11-08 NOTE — ED Notes (Signed)
[facility] contacted to transport patient to [place] 

## 2017-11-08 NOTE — ED Provider Notes (Signed)
[facility] Provider Note   CSN: [id number] Arrival date & time: 11/08/17  [id number]     History   Chief Complaint Chief Complaint  Patient presents with  . Flank Pain    HPI Marvin Henry is a 27 y.o. male.  Patient with known bilateral ureteral stones presents with uncontrolled right-sided flank pain.  Patient has been seen several times over the past week (starting 7/1) for the same, most recently at [facility] yesterday.  Initially had L ureteral stone, then yesterday diagnosed with bilateral ureteral stones (see CT read below). Plan is for patient to follow-up with urologist on Monday @ 8:00am (tomorrow).  Patient has been taking oxycodone on a schedule at home.  He denies fevers, diarrhea.  He has had multiple episodes of vomiting at home.  Small amount of urine at 3am, did urinate a good amount last evening. He thinks he may have passed a stone last night but did not strain urine to confirm. States that when he was hospitalized, Toradol helped his symptoms.  The onset of this condition was acute. The course is intermittent. Aggravating factors: none. Alleviating factors: none.   CT 11/07/2017 @ [facility]:   IMPRESSION: 1. 1 mm stone within the distal LEFT ureter, progressed slightly in the interval, now located just proximal to the LEFT UVJ. There is now an associated mild LEFT-sided hydronephrosis. 2. New 1 mm stone within the distal RIGHT ureter, located just proximal to the RIGHT UVJ, causing mild RIGHT-sided hydronephrosis. This 1 mm stone was located in the RIGHT renal pelvis on earlier CT of 11/02/2017, passing to the distal RIGHT ureter since the previous exam.        Past Medical History:  Diagnosis Date  . Lyme disease   . Nephrolithiasis     There are no active problems to display for this patient.   Past Surgical History:  Procedure Laterality Date  . APPENDECTOMY          Home Medications      Prior to Admission medications   Medication Sig Start Date End Date Taking? Authorizing Provider  ondansetron (ZOFRAN ODT) 4 MG disintegrating tablet Take 1 tablet (4 mg total) by mouth every 8 (eight) hours as needed for nausea or vomiting. 11/02/17   Liberty HandyGibbons, Claudia J, PA-C  oxyCODONE-acetaminophen (PERCOCET/ROXICET) 5-325 MG tablet Take 1 tablet by mouth every 4 (four) hours as needed for moderate pain or severe pain. 11/04/17   Mancel BaleWentz, Elliott, MD  tamsulosin (FLOMAX) 0.4 MG CAPS capsule Take 1 capsule (0.4 mg total) by mouth daily for 10 days. 11/02/17 11/12/17  Liberty HandyGibbons, Claudia J, PA-C  VENTOLIN HFA 108 (90 Base) MCG/ACT inhaler Inhale 2 puffs into the lungs every 4 (four) hours as needed for wheezing. 08/20/17   [provider]    Family History No family history on file.  Social History Social History   Tobacco Use  . Smoking status: Current Every Day Smoker    Packs/day: 1.00    Types: Cigarettes  . Smokeless tobacco: Never Used  Substance Use Topics  . Alcohol use: Yes    Comment: occ  . Drug use: Never    Comment: CBD oil     Allergies   Sulfamethoxazole   Review of Systems Review of Systems  Constitutional: Negative for fever.  HENT: Negative for rhinorrhea and sore throat.   Eyes: Negative for redness.  Respiratory: Negative for cough.   Cardiovascular: Negative for chest pain.  Gastrointestinal: Positive  for nausea and vomiting. Negative for abdominal pain and diarrhea.  Genitourinary: Positive for flank pain. Negative for dysuria and hematuria.  Musculoskeletal: Negative for myalgias.  Skin: Negative for rash.  Neurological: Negative for headaches.     Physical Exam Updated Vital Signs BP (!) 114/44 (BP Location: Right Arm)   Pulse 95   Temp 98 F (36.7 C) (Oral)   Resp (!) 22   Ht 5\' 7"  (1.702 m)   Wt 66.7 kg (147 lb)   SpO2 100%   BMI 23.02 kg/m   Physical Exam  Constitutional: He appears well-developed and well-nourished. He appears  distressed.  HENT:  Head: Normocephalic and atraumatic.  Mouth/Throat: Oropharynx is clear and moist.  Eyes: Conjunctivae are normal. Right eye exhibits no discharge. Left eye exhibits no discharge.  Neck: Normal range of motion. Neck supple.  Cardiovascular: Normal rate, regular rhythm and normal heart sounds.  Pulmonary/Chest: Effort normal and breath sounds normal. No respiratory distress.  Abdominal: Soft. There is no tenderness. There is no rebound and no guarding.  Neurological: He is alert.  Skin: Skin is warm and dry.  Psychiatric: He has a normal mood and affect.  Nursing note and vitals reviewed.    ED Treatments / Results  Labs (all labs ordered are listed, but only abnormal results are displayed) Labs Reviewed  CBC - Abnormal; Notable for the following components:      Result Value   WBC 18.3 (*)    All other components within normal limits  BASIC METABOLIC PANEL - Abnormal; Notable for the following components:   CO2 20 (*)    Glucose, Bld 102 (*)    Creatinine, Ser 1.43 (*)    All other components within normal limits  URINALYSIS, ROUTINE W REFLEX MICROSCOPIC - Abnormal; Notable for the following components:   Hgb urine dipstick MODERATE (*)    Ketones, ur 80 (*)    RBC / HPF >50 (*)    All other components within normal limits    EKG None  Radiology No results found.  Procedures Procedures (including critical care time)  Medications Ordered in ED Medications  sodium chloride 0.9 % bolus 1,000 mL (has no administration in time range)  acetaminophen (OFIRMEV) IV 1,000 mg (has no administration in time range)  ketorolac (TORADOL) 30 MG/ML injection 30 mg (30 mg Intravenous Given 11/08/17 0727)  HYDROmorphone (DILAUDID) injection 0.5 mg (0.5 mg Intravenous Given 11/08/17 0727)  ondansetron (ZOFRAN) injection 4 mg (4 mg Intravenous Given 11/08/17 0727)  sodium chloride 0.9 % bolus 1,000 mL (0 mLs Intravenous Stopped 11/08/17 1118)  HYDROmorphone (DILAUDID)  injection 0.5 mg (0.5 mg Intravenous Given 11/08/17 0912)  oxyCODONE-acetaminophen (PERCOCET/ROXICET) 5-325 MG per tablet 1 tablet (1 tablet Oral Given 11/08/17 0912)  HYDROmorphone (DILAUDID) injection 0.5 mg (0.5 mg Intravenous Given 11/08/17 1129)     Initial Impression / Assessment and Plan / ED Course  I have reviewed the triage vital signs and the nursing notes.  Pertinent labs & imaging results that were available during my care of the patient were reviewed by me and considered in my medical decision making (see chart for details).     Patient seen and examined.  Reviewed previous records and imaging.  Work-up initiated. Medications ordered.   Vital signs reviewed and are as follows: BP (!) 114/44 (BP Location: Right Arm)   Pulse 95   Temp 98 F (36.7 C) (Oral)   Resp (!) 22   Ht 5\' 7"  (1.702 m)   Wt 66.7  kg (147 lb)   SpO2 100%   BMI 23.02 kg/m   8:35 AM Creatinine yesterday 1.03 --> 1.43 today.   Pt's pain currently controlled. Will hydrate. Will need to ensure good UOP.   10:37 AM Patient seen with Dr. Rosalia Hammers. Pt needed re-dosing of IV pain medications. Pain is currently mild to moderate. No UTI on UA.   Will discuss with urology.   10:47 AM Spoke with Dr. Annabell Howells who will see. Pt updated.   11:20 AM Dr. Annabell Howells at bedside. IV tylenol and dilaudid ordered for recurrent pain.    Final Clinical Impressions(s) / ED Diagnoses   Final diagnoses:  Bilateral ureteral calculi   Kidney stones, bilateral, intractable pain. To OR.   ED Discharge Orders    None       Renne Crigler, Cordelia Poche 11/08/17 1131    Margarita Grizzle, MD 11/08/17 1640

## 2017-11-08 NOTE — ED Notes (Signed)
Patient ambulatory to restroom to give urine sample.  

## 2017-11-08 NOTE — Anesthesia Procedure Notes (Signed)
Procedure Name: Intubation Date/Time: 11/08/2017 1:03 PM Performed by: Vanessa Durhamochran, Karesha Trzcinski Glenn, CRNA Pre-anesthesia Checklist: Patient identified, Emergency Drugs available, Suction available and Patient being monitored Patient Re-evaluated:Patient Re-evaluated prior to induction Oxygen Delivery Method: Circle system utilized Preoxygenation: Pre-oxygenation with 100% oxygen Induction Type: IV induction, Rapid sequence and Cricoid Pressure applied Laryngoscope Size: 2 and Miller Grade View: Grade I Tube type: Oral Tube size: 7.5 mm Number of attempts: 1 Airway Equipment and Method: Stylet Placement Confirmation: ETT inserted through vocal cords under direct vision,  positive ETCO2 and breath sounds checked- equal and bilateral Secured at: 22 cm Tube secured with: Tape Dental Injury: Teeth and Oropharynx as per pre-operative assessment

## 2017-11-08 NOTE — ED Notes (Signed)
[facility] arrived to transport patient.  Consent signed

## 2017-11-08 NOTE — Consult Note (Signed)
Subjective: CC: right flank pain.  Hx: Marvin Henry is a 27 yo WM who I was asked to see in consultation by Rhea Bleacher PA at [facility] ER for ureteral stones and intractable pain.   Had the onset on 7/1 of left flank pain and came to [facility] ER where a CT showed a small left distal stone.   He has subsequently been back to the [facility] ER on 7/3 and at [facility] ER on 7/6 with recurrent pain, but yesterday the pain had moved to the right side and a repeat CT on 7/6 showed bilateral small distal stones about 1-5mm in size.  He has small bilateral renal stones as well.  He has had severe nausea and vomiting but no voiding complaints.  He passed a stone about 4 years ago but has no other associated signs or symptoms.   His UA has >50 RBC's.   ROS:  Review of Systems  Constitutional: Negative for chills and fever.  Cardiovascular: Positive for chest pain (following prolonged vomiting.).  Gastrointestinal: Positive for abdominal pain, nausea and vomiting.  Genitourinary: Positive for flank pain.  All other systems reviewed and are negative.   Allergies  Allergen Reactions  . Sulfamethoxazole Rash    Past Medical History:  Diagnosis Date  . Lyme disease   . Nephrolithiasis     Past Surgical History:  Procedure Laterality Date  . APPENDECTOMY      Social History   Socioeconomic History  . Marital status: Single    Spouse name: Not on file  . Number of children: Not on file  . Years of education: Not on file  . Highest education level: Not on file  Occupational History  . Not on file  Social Needs  . Financial resource strain: Not on file  . Food insecurity:    Worry: Not on file    Inability: Not on file  . Transportation needs:    Medical: Not on file    Non-medical: Not on file  Tobacco Use  . Smoking status: Current Every Day Smoker    Packs/day: 1.00    Types: Cigarettes  . Smokeless tobacco: Never Used  Substance and Sexual Activity  . Alcohol use: Yes    Comment: occ  .  Drug use: Yes    Types: Marijuana    Comment: CBD oil  . Sexual activity: Not on file  Lifestyle  . Physical activity:    Days per week: Not on file    Minutes per session: Not on file  . Stress: Not on file  Relationships  . Social connections:    Talks on phone: Not on file    Gets together: Not on file    Attends religious service: Not on file    Active member of club or organization: Not on file    Attends meetings of clubs or organizations: Not on file    Relationship status: Not on file  . Intimate partner violence:    Fear of current or ex partner: Not on file    Emotionally abused: Not on file    Physically abused: Not on file    Forced sexual activity: Not on file  Other Topics Concern  . Not on file  Social History Narrative  . Not on file    History reviewed. No pertinent family history.  Anti-infectives: Anti-infectives (From admission, onward)   None      Current Facility-Administered Medications  Medication Dose Route Frequency Provider Last Rate Last Dose  .  acetaminophen (OFIRMEV) IV 1,000 mg  1,000 mg Intravenous Q6H Renne CriglerGeiple, Joshua, PA-C      . HYDROmorphone (DILAUDID) injection 0.5 mg  0.5 mg Intravenous Once Renne CriglerGeiple, Joshua, PA-C      . sodium chloride 0.9 % bolus 1,000 mL  1,000 mL Intravenous Once Renne CriglerGeiple, Joshua, PA-C       Current Outpatient Medications  Medication Sig Dispense Refill  . ondansetron (ZOFRAN ODT) 4 MG disintegrating tablet Take 1 tablet (4 mg total) by mouth every 8 (eight) hours as needed for nausea or vomiting. 20 tablet 0  . oxyCODONE-acetaminophen (PERCOCET/ROXICET) 5-325 MG tablet Take 1 tablet by mouth every 4 (four) hours as needed for moderate pain or severe pain. 20 tablet 0  . tamsulosin (FLOMAX) 0.4 MG CAPS capsule Take 1 capsule (0.4 mg total) by mouth daily for 10 days. 10 capsule 0  . VENTOLIN HFA 108 (90 Base) MCG/ACT inhaler Inhale 2 puffs into the lungs every 4 (four) hours as needed for wheezing.  0      Objective: Vital signs in last 24 hours: Temp:  [98 F (36.7 C)] 98 F (36.7 C) (07/07 0629) Pulse Rate:  [95-105] 95 (07/07 0705) Resp:  [20-22] 22 (07/07 0705) BP: (108-114)/(44-81) 114/44 (07/07 0705) SpO2:  [98 %-100 %] 100 % (07/07 0705) Weight:  [66.7 kg (147 lb)] 66.7 kg (147 lb) (07/07 0653)  Intake/Output from previous day: No intake/output data recorded. Intake/Output this shift: No intake/output data recorded.   Physical Exam  Constitutional: He is oriented to person, place, and time. He appears well-developed and well-nourished. He appears distressed (in pain).  HENT:  Head: Normocephalic and atraumatic.  Neck: Normal range of motion. Neck supple. No thyromegaly present.  Cardiovascular: Normal rate, regular rhythm and normal heart sounds.  Pulmonary/Chest: Effort normal and breath sounds normal. No respiratory distress.  Abdominal: Soft. There is tenderness (right lower quadrant and flank tenderness.).  Musculoskeletal: Normal range of motion. He exhibits no edema or tenderness.  Lymphadenopathy:    He has no cervical adenopathy.  Neurological: He is alert and oriented to person, place, and time.  Skin: Skin is warm and dry.  Psychiatric:  Mood agitated with pain.  Affect normal.   Vitals reviewed.   Lab Results:  Recent Labs    11/08/17 0719  WBC 18.3*  HGB 14.2  HCT 41.7  PLT 232   BMET Recent Labs    11/08/17 0719  NA 140  K 3.8  CL 108  CO2 20*  GLUCOSE 102*  BUN 11  CREATININE 1.43*  CALCIUM 9.1   PT/INR No results for input(s): LABPROT, INR in the last 72 hours. ABG No results for input(s): PHART, HCO3 in the last 72 hours.  Invalid input(s): PCO2, PO2  Studies/Results: No results found.  I have reviewed his labs and UA.  I have reviewed his CT's from 7/1 and 11/07/17.  I have discussed his case with the ER PA.   Assessment: Bilateral distal ureteral stones with intractable right flank pain and nausea.   AKI with Cr of  1.43.  Small bilateral renal stones.    I discussed the options for therapy including further medical management vs blaterl ureteroscopic stone extraction.   He has elected ureteroscopy so [facility].  I reviewed the risks of bleeding, infection, ureteral injury, need for a stent or secondary procedures, thrombotic events and anesthetic complications.     CC: Rhea BleacherJosh Geiple PA     Marvin Henry  11/08/2017 [phone]  

## 2017-11-08 NOTE — Op Note (Signed)
Procedure: 1. Cystoscopy with right ureteroscopy.  2 insertion of right double-J stent. 3.  Left ureteroscopy with holmium laser application and stent insertion.  Preop diagnosis: Bilateral distal ureteral stones.  Postop diagnosis: Same with bilateral distal strictures.  Surgeon: Dr. Bjorn PippinJohn Kenosha Henry.  Anesthesia: General.  Specimen: None.  Drains: Bilateral 4.8 French by 26 cm contour double-J stent's.  EBL: Minimal.  Complications: Minimal.  Indications: Marvin Henry is a 27 year old white male who came to the emergency room for the fourth time in a week today for ureteral calculi.  He originally presented with left flank pain and was found to have a 1 to 2 mm left distal ureteral stone on 11/02/2017, but on subsequent imaging at an ER visit on 11/07/2017 he was found to have bilateral both proximally 1 to 2 mm.  He has had intractable pain and nausea with minimal temporary relief with Toradol, hydromorphone and Tylenol.  It was felt that cystoscopy with bilateral ureteroscopy was indicated.  Procedure: He was given 2 g of Ancef.  He was taken operating room where general anesthetic was induced.  He was placed in lithotomy position and fitted with PAS hose.  Her perineum and genitalia were prepped with Betadine solution and draped in usual sterile fashion.  Cystoscopy was performed using the 23 JamaicaFrench scope and 30 degree lens.  Examination revealed a normal urethra, and intact external sphincter and a short prostatic urethra with trilobar hyperplasia but minimal obstruction.  Examination of bladder revealed normal mucosa with mild trabeculation and no tumors or stones were identified.  Ureteral orifices were in the normal anatomic position.  An initial attempt with the 4.5 French semirigid ureteroscope was then made on the right.  I was able to enter the meatus but the ureter just proximal to the meatus was extremely tight.  I then passed a sensor guidewire through the ureteroscope and advanced it to  the kidney under fluoroscopic guidance.  The ureteroscope was removed and an initial attempt to dilate the ureter with an 8 French dual-lumen catheter was made but I was unable to get the catheter beyond the very distal ureter.  I then passed a 5 [place]French opening catheter with minimal resistance followed by 6 French opening catheter over the wire with moderate resistance.  I then inserted the ureteroscope over the wire but was unable to advance it to the level of the presumed stone.  The stones were quite small and not seen on fluoroscopic evaluation.  At this point I removed the ureteroscope the left the wire in place.  I then turned my attention to the left ureter and encountered a similar situation with very tight intramural ureter that would not dilate readily with the 8 French catheter however I could pass the 6 [place]French up.  I passed a wire through the ureteroscope prior to passing the dilators.  I was able to eventually pass the ureteroscope up to the level of the stone which was tightly impacted in the intramural ureter.  I passed a 200 m laser fiber which I set on 0.5 W and 10 Hz and was able to give the stone a few shocks which jostled it loose.  But I had difficulty maintaining contact with the stone due to the angulation of the ureter and the wire within the lumen.  I removed the ureteroscope and then reinserted it over the wire and I was able to advance it above the level of the stone into the more  dilated proximal ureter.  I did not  see any stone material above the level of the intramural ureter.  I withdrew the ureteroscope and did not see any stone material and felt that it is likely passed into the bladder.  I then reinserted the guidewire to the kidney and remove the ureteroscope.  I inserted the cystoscope and inspected the bladder and could not find any residual stone fragments.  I made one more attempt on the right the past the ureteroscope over the wire without success.  At this point I  reinserted the the cystoscope over the wire to the left kidney and passed a 4.8 [place] by 26 cm contour double-J stent under fluoroscopic guidance.  The wire was removed leaving a good coil in the kidney and a good coil in the bladder.  I then reinserted the cystoscope over the right-sided wire and passed a 4.8 [place] by 26 cm contour double-J stent also under fluoroscopic guidance with good residual coiling upon removal of the wire.  No tethers were left.  At this point the bladder was drained and the cystoscope was removed.  The urethra was instilled with 10 cc of 2% lidocaine jelly which was secured with a penile clamp and a B&O suppository was placed.  He was taken down from lithotomy position, his anesthetic was reversed and he was moved to recovery room in stable condition.  There were no complications but he will require another trip to the operating room after the ureters have had a chance to self dilate over the next week or 2.

## 2017-11-08 NOTE — Transfer of Care (Signed)
Immediate Anesthesia Transfer of Care Note  Patient: Marvin Henry  Procedure(s) Performed: CYSTOSCOPY, BILATERAL URETEROSCOPY AND BILATERAL URETERAL STENT PLACEMENT (Bilateral Ureter)  Patient Location: PACU  Anesthesia Type:General  Level of Consciousness: drowsy  Airway & Oxygen Therapy: Patient Spontanous Breathing and Patient connected to face mask  Post-op Assessment: Report given to RN and Post -op Vital signs reviewed and stable  Post vital signs: Reviewed and stable  Last Vitals:  Vitals Value Taken Time  BP 118/73 11/08/2017  2:14 PM  Temp    Pulse 76 11/08/2017  2:14 PM  Resp 13 11/08/2017  2:14 PM  SpO2 100 % 11/08/2017  2:14 PM  Vitals shown include unvalidated device data.  Last Pain:  Vitals:   11/08/17 1115  TempSrc: Oral  PainSc: 10-Worst pain ever         Complications: No apparent anesthesia complications

## 2017-11-08 NOTE — ED Notes (Signed)
[facility] contacted to transport patient to [facility] 

## 2017-11-08 NOTE — Anesthesia Preprocedure Evaluation (Signed)
Anesthesia Evaluation  Patient identified by MRN, date of birth, ID band Patient awake    Reviewed: Allergy & Precautions, NPO status , Patient's Chart, lab work & pertinent test results  Airway Mallampati: II  TM Distance: >3 FB Neck ROM: Full    Dental  (+) Dental Advisory Given   Pulmonary Current Smoker,    breath sounds clear to auscultation       Cardiovascular negative cardio ROS   Rhythm:Regular Rate:Normal     Neuro/Psych negative neurological ROS     GI/Hepatic negative GI ROS, Neg liver ROS,   Endo/Other    Renal/GU Renal InsufficiencyRenal disease     Musculoskeletal   Abdominal   Peds  Hematology negative hematology ROS (+)   Anesthesia Other Findings   Reproductive/Obstetrics                             Lab Results  Component Value Date   WBC 18.3 (H) 11/08/2017   HGB 14.2 11/08/2017   HCT 41.7 11/08/2017   MCV 88.3 11/08/2017   PLT 232 11/08/2017   Lab Results  Component Value Date   CREATININE 1.43 (H) 11/08/2017   BUN 11 11/08/2017   NA 140 11/08/2017   K 3.8 11/08/2017   CL 108 11/08/2017   CO2 20 (L) 11/08/2017    Anesthesia Physical Anesthesia Plan  ASA: II and emergent  Anesthesia Plan: General   Post-op Pain Management:    Induction: Intravenous and Rapid sequence  PONV Risk Score and Plan: 2 and Ondansetron, Dexamethasone and Treatment may vary due to age or medical condition  Airway Management Planned: Oral ETT  Additional Equipment:   Intra-op Plan:   Post-operative Plan: Extubation in OR  Informed Consent: I have reviewed the patients History and Physical, chart, labs and discussed the procedure including the risks, benefits and alternatives for the proposed anesthesia with the patient or authorized representative who has indicated his/her understanding and acceptance.     Plan Discussed with:   Anesthesia Plan Comments:          Anesthesia Quick Evaluation

## 2017-11-08 NOTE — Progress Notes (Signed)
PACU NURSING DISCHARGE NOTE: pt awake, oriented x 4, mae x 4, tolerates PO fluids well, cont to deny any pain post operatively, was able to ambulate to restroom with very min assistance, gait very steady, voided w/o difficulty. DC instructions from Anesthesia team along with DC written information provided to pt. Discussed Post Procedural diet, importance of rest for next 24 hours, not to consume alcohol, drive or make important decisions as per outlined by anesthesia team. Also reviewed and discussed importance of safety while taking PO pain med as written by primary MD, also encouraged PO fluids to aid in urination, education in regards to handwashing also provided. Discussed when to cal MD as outlined on AVS instructions sheet. Pt escorted via wc to exit. Copy of AVS and the two Rx by MD given to pts mother.

## 2017-11-08 NOTE — ED Triage Notes (Addendum)
Pt transported from home by EMS for L abd/flank pain. Pt seen here x 2 for non obstructing punctate kidney stone. Per EMS pt states he took his pain meds but vomited.  IV est, Fentanyl 150mcg and Zofran 8mg  given.  On arrival pt writhing, moaning on arrival, unable to obtain BP d/t movement.  Wife at bedside states [facility], states he had another CT and the state more stones have dropped.  Pt continues to moan and shake with pain.  Pt did have some improvement with Toradol previously.

## 2017-11-09 ENCOUNTER — Encounter (HOSPITAL_COMMUNITY): Payer: Self-pay | Admitting: Urology

## 2017-11-09 NOTE — Anesthesia Postprocedure Evaluation (Signed)
Anesthesia Post Note  Patient: Paulo Fruitimothy Campa  Procedure(s) Performed: CYSTOSCOPY, BILATERAL URETEROSCOPY AND BILATERAL URETERAL STENT PLACEMENT (Bilateral Ureter)     Patient location during evaluation: PACU Anesthesia Type: General Level of consciousness: awake and alert Pain management: pain level controlled Vital Signs Assessment: post-procedure vital signs reviewed and stable Respiratory status: spontaneous breathing, nonlabored ventilation, respiratory function stable and patient connected to nasal cannula oxygen Cardiovascular status: blood pressure returned to baseline and stable Postop Assessment: no apparent nausea or vomiting Anesthetic complications: no    Last Vitals:  Vitals:   11/08/17 1440 11/08/17 1457  BP:  130/86  Pulse: 90   Resp:    Temp:  36.6 C  SpO2: 97%     Last Pain:  Vitals:   11/08/17 1457  TempSrc:   PainSc: 0-No pain                 Kennieth RadFitzgerald, Tashonda Pinkus E

## 2017-11-10 ENCOUNTER — Other Ambulatory Visit: Payer: Self-pay | Admitting: Urology

## 2017-11-10 ENCOUNTER — Telehealth: Payer: Self-pay | Admitting: Urology

## 2017-11-10 NOTE — Telephone Encounter (Signed)
Contacted by patients mother who called to report passage of two small blood clots with voiding today. Patient's urine otherwise clear. He is emptying well. No fevers/chills or nausea vomiting. Pain well controlled although does report some discomfort immediately after voiding. Reassurance provided. Advised to call back with worsening symptoms or any other concerns. She was appreciative of the call.

## 2017-11-18 NOTE — Patient Instructions (Signed)
Marvin Henry  11/18/2017   Your procedure is scheduled on: 11-24-17   [facility]    Report to admitting at 5:30AM    Call this number if you have problems the morning of surgery [phone]     Remember: Do not eat food or drink liquids :After Midnight.     Take these medicines the morning of surgery with A SIP OF WATER: tamsulosin, tramadol if needed                                You may not have any metal on your body including hair pins and              piercings  Do not wear jewelry, make-up, lotions, powders or perfumes, deodorant               Men may shave face and neck.   Do not bring valuables to the hospital. [facility] IS NOT             RESPONSIBLE   FOR VALUABLES.  Contacts, dentures or bridgework may not be worn into surgery.      Patients discharged the day of surgery will not be allowed to drive home.  Name and phone number of your driver:  Special Instructions: N/A              Please read over the following fact sheets you were given: _____________________________________________________________________             [facility] - Preparing for [facility] Before surgery, you can play an important role.  Because skin is not sterile, your skin needs to be as free of germs as possible.  You can reduce the number of germs on your skin by washing with CHG (chlorahexidine gluconate) soap before surgery.  CHG is an antiseptic cleaner which kills germs and bonds with the skin to continue killing germs even after washing. Please DO NOT use if you have an allergy to CHG or antibacterial soaps.  If your skin becomes reddened/irritated stop using the CHG and inform your nurse when you arrive at Short Stay. Do not shave (including legs and underarms) for at least 48 hours prior to the first CHG shower.  You may shave your face/neck. Please follow these instructions carefully:  1.  Shower with CHG Soap the night before  surgery and the  morning of Surgery.  2.  If you choose to wash your hair, wash your hair first as usual with your  normal  shampoo.  3.  After you shampoo, rinse your hair and body thoroughly to remove the  shampoo.                           4.  Use CHG as you would any other liquid soap.  You can apply chg directly  to the skin and wash                       Gently with a scrungie or clean washcloth.  5.  Apply the CHG Soap to your body ONLY FROM THE NECK DOWN.   Do not use on face/ open  Wound or open sores. Avoid contact with eyes, ears mouth and genitals (private parts).                       Wash face,  Genitals (private parts) with your normal soap.             6.  Wash thoroughly, paying special attention to the area where your surgery  will be performed.  7.  Thoroughly rinse your body with warm water from the neck down.  8.  DO NOT shower/wash with your normal soap after using and rinsing off  the CHG Soap.                9.  Pat yourself dry with a clean towel.            10.  Wear clean pajamas.            11.  Place clean sheets on your bed the night of your first shower and do not  sleep with pets. Day of Surgery : Do not apply any lotions/deodorants the morning of surgery.  Please wear clean clothes to the hospital/surgery center.  FAILURE TO FOLLOW THESE INSTRUCTIONS MAY RESULT IN THE CANCELLATION OF YOUR SURGERY PATIENT SIGNATURE_________________________________  NURSE SIGNATURE__________________________________  ________________________________________________________________________

## 2017-11-18 NOTE — Progress Notes (Signed)
cxr 08-20-17 [facility] epic   Cbc, bmp, ua 11-08-17 epic

## 2017-11-19 ENCOUNTER — Encounter (HOSPITAL_COMMUNITY)
Admission: RE | Admit: 2017-11-19 | Discharge: 2017-11-19 | Disposition: A | Discharge status: Home / Self Care | Payer: 59 | Source: Ambulatory Visit | Attending: Urology | Admitting: Urology

## 2017-11-19 ENCOUNTER — Other Ambulatory Visit: Payer: Self-pay

## 2017-11-19 ENCOUNTER — Encounter (HOSPITAL_COMMUNITY): Payer: Self-pay

## 2017-11-19 DIAGNOSIS — Z01812 Encounter for preprocedural laboratory examination: Secondary | ICD-10-CM | POA: Insufficient documentation

## 2017-11-19 HISTORY — DX: Unspecified asthma, uncomplicated: J45.909

## 2017-11-19 LAB — CBC
HCT: 41.4 % (ref 39.0–52.0)
HEMOGLOBIN: 14 g/dL (ref 13.0–17.0)
MCH: 30.6 pg (ref 26.0–34.0)
MCHC: 33.8 g/dL (ref 30.0–36.0)
MCV: 90.4 fL (ref 78.0–100.0)
Platelets: 356 10*3/uL (ref 150–400)
RBC: 4.58 MIL/uL (ref 4.22–5.81)
RDW: 12.6 % (ref 11.5–15.5)
WBC: 8 10*3/uL (ref 4.0–10.5)

## 2017-11-24 ENCOUNTER — Other Ambulatory Visit: Payer: Self-pay

## 2017-11-24 ENCOUNTER — Ambulatory Visit (HOSPITAL_COMMUNITY): Payer: 59 | Admitting: Certified Registered Nurse Anesthetist

## 2017-11-24 ENCOUNTER — Encounter (HOSPITAL_COMMUNITY): Payer: Self-pay | Admitting: *Deleted

## 2017-11-24 ENCOUNTER — Ambulatory Visit (HOSPITAL_COMMUNITY): Payer: 59

## 2017-11-24 ENCOUNTER — Ambulatory Visit (HOSPITAL_COMMUNITY)
Admission: RE | Admit: 2017-11-24 | Discharge: 2017-11-24 | Disposition: A | Discharge status: Home / Self Care | Payer: 59 | Source: Ambulatory Visit | Attending: Urology | Admitting: Urology

## 2017-11-24 ENCOUNTER — Encounter (HOSPITAL_COMMUNITY)
Admission: RE | Disposition: A | Discharge status: Home / Self Care | Payer: Self-pay | Source: Ambulatory Visit | Attending: Urology

## 2017-11-24 DIAGNOSIS — F17210 Nicotine dependence, cigarettes, uncomplicated: Secondary | ICD-10-CM | POA: Diagnosis not present

## 2017-11-24 DIAGNOSIS — N202 Calculus of kidney with calculus of ureter: Secondary | ICD-10-CM | POA: Insufficient documentation

## 2017-11-24 HISTORY — PX: CYSTOSCOPY WITH URETEROSCOPY AND STENT PLACEMENT: SHX6377

## 2017-11-24 LAB — BASIC METABOLIC PANEL
Anion gap: 9 (ref 5–15)
BUN: 13 mg/dL (ref 6–20)
CO2: 26 mmol/L (ref 22–32)
Calcium: 9.1 mg/dL (ref 8.9–10.3)
Chloride: 106 mmol/L (ref 98–111)
Creatinine, Ser: 0.95 mg/dL (ref 0.61–1.24)
GFR calc non Af Amer: >60 mL/min (ref >60)
Glucose, Bld: 111 mg/dL — ABNORMAL HIGH (ref 70–99)
POTASSIUM: 4.2 mmol/L (ref 3.5–5.1)
Sodium: 141 mmol/L (ref 135–145)

## 2017-11-24 SURGERY — CYSTOURETEROSCOPY, WITH STENT INSERTION
Anesthesia: General | Laterality: Bilateral

## 2017-11-24 MED ORDER — LIDOCAINE HCL URETHRAL/MUCOSAL 2 % EX GEL
CUTANEOUS | Status: DC | PRN
  Administered 2017-11-24: 1 via URETHRAL

## 2017-11-24 MED ORDER — SODIUM CHLORIDE 0.9 % IV SOLN: 250.0000 mL | INTRAVENOUS | Status: DC | PRN

## 2017-11-24 MED ORDER — LACTATED RINGERS IV SOLN
INTRAVENOUS | Status: DC
  Administered 2017-11-24 (×2): via INTRAVENOUS

## 2017-11-24 MED ORDER — PROPOFOL 10 MG/ML IV BOLUS
INTRAVENOUS | Status: DC | PRN
  Administered 2017-11-24: 200 mg via INTRAVENOUS

## 2017-11-24 MED ORDER — FENTANYL CITRATE (PF) 100 MCG/2ML IJ SOLN: 25.0000 ug | INTRAMUSCULAR | Status: DC | PRN

## 2017-11-24 MED ORDER — OXYCODONE HCL 5 MG PO TABS
ORAL_TABLET | ORAL | Status: AC
  Filled 2017-11-24: qty 1

## 2017-11-24 MED ORDER — OXYCODONE HCL 5 MG PO TABS
5.0000 mg | ORAL_TABLET | Freq: Once | ORAL | Status: AC | PRN
  Administered 2017-11-24: 5 mg via ORAL

## 2017-11-24 MED ORDER — KETOROLAC TROMETHAMINE 30 MG/ML IJ SOLN: 30.0000 mg | Freq: Once | INTRAMUSCULAR | Status: DC | PRN

## 2017-11-24 MED ORDER — SODIUM CHLORIDE 0.9% FLUSH: 3.0000 mL | Freq: Two times a day (BID) | INTRAVENOUS | Status: DC

## 2017-11-24 MED ORDER — FENTANYL CITRATE (PF) 100 MCG/2ML IJ SOLN
INTRAMUSCULAR | Status: DC | PRN
  Administered 2017-11-24: 25 ug via INTRAVENOUS
  Administered 2017-11-24 (×2): 50 ug via INTRAVENOUS
  Administered 2017-11-24 (×2): 25 ug via INTRAVENOUS

## 2017-11-24 MED ORDER — FENTANYL CITRATE (PF) 100 MCG/2ML IJ SOLN
INTRAMUSCULAR | Status: AC
  Filled 2017-11-24: qty 2

## 2017-11-24 MED ORDER — PROPOFOL 10 MG/ML IV BOLUS
INTRAVENOUS | Status: AC
  Filled 2017-11-24: qty 40

## 2017-11-24 MED ORDER — SODIUM CHLORIDE 0.9% FLUSH: 3.0000 mL | INTRAVENOUS | Status: DC | PRN

## 2017-11-24 MED ORDER — CEFAZOLIN SODIUM-DEXTROSE 2-4 GM/100ML-% IV SOLN
2.0000 g | INTRAVENOUS | Status: AC
  Administered 2017-11-24: 2 g via INTRAVENOUS
  Filled 2017-11-24: qty 100

## 2017-11-24 MED ORDER — PHENYLEPHRINE 40 MCG/ML (10ML) SYRINGE FOR IV PUSH (FOR BLOOD PRESSURE SUPPORT)
PREFILLED_SYRINGE | INTRAVENOUS | Status: DC | PRN
  Administered 2017-11-24 (×3): 80 ug via INTRAVENOUS

## 2017-11-24 MED ORDER — ONDANSETRON 4 MG PO TBDP: 4.0000 mg | ORAL_TABLET | Freq: Three times a day (TID) | ORAL | 0 refills | Status: AC | PRN

## 2017-11-24 MED ORDER — ONDANSETRON HCL 4 MG/2ML IJ SOLN
INTRAMUSCULAR | Status: DC | PRN
  Administered 2017-11-24: 4 mg via INTRAVENOUS

## 2017-11-24 MED ORDER — POTASSIUM CITRATE ER 10 MEQ (1080 MG) PO TBCR: 10.0000 meq | EXTENDED_RELEASE_TABLET | Freq: Three times a day (TID) | ORAL | 11 refills | Status: AC

## 2017-11-24 MED ORDER — MIDAZOLAM HCL 2 MG/2ML IJ SOLN
INTRAMUSCULAR | Status: DC | PRN
  Administered 2017-11-24: 2 mg via INTRAVENOUS

## 2017-11-24 MED ORDER — LIDOCAINE 2% (20 MG/ML) 5 ML SYRINGE
INTRAMUSCULAR | Status: DC | PRN
  Administered 2017-11-24: 60 mg via INTRAVENOUS

## 2017-11-24 MED ORDER — PROMETHAZINE HCL 25 MG/ML IJ SOLN: 6.2500 mg | INTRAMUSCULAR | Status: DC | PRN

## 2017-11-24 MED ORDER — ACETAMINOPHEN 325 MG PO TABS: 650.0000 mg | ORAL_TABLET | ORAL | Status: DC | PRN

## 2017-11-24 MED ORDER — MORPHINE SULFATE (PF) 4 MG/ML IV SOLN: 2.0000 mg | INTRAVENOUS | Status: DC | PRN

## 2017-11-24 MED ORDER — DEXAMETHASONE SODIUM PHOSPHATE 4 MG/ML IJ SOLN
INTRAMUSCULAR | Status: DC | PRN
  Administered 2017-11-24: 10 mg via INTRAVENOUS

## 2017-11-24 MED ORDER — MIDAZOLAM HCL 2 MG/2ML IJ SOLN
INTRAMUSCULAR | Status: AC
  Filled 2017-11-24: qty 2

## 2017-11-24 MED ORDER — BELLADONNA ALKALOIDS-OPIUM 16.2-60 MG RE SUPP
RECTAL | Status: DC | PRN
  Administered 2017-11-24: 1 via RECTAL

## 2017-11-24 MED ORDER — SODIUM CHLORIDE 0.9 % IR SOLN
Status: DC | PRN
  Administered 2017-11-24: 3000 mL via INTRAVESICAL

## 2017-11-24 MED ORDER — ACETAMINOPHEN 650 MG RE SUPP
650.0000 mg | RECTAL | Status: DC | PRN
  Filled 2017-11-24: qty 1

## 2017-11-24 MED ORDER — LIDOCAINE HCL URETHRAL/MUCOSAL 2 % EX GEL
CUTANEOUS | Status: AC
  Filled 2017-11-24: qty 5

## 2017-11-24 MED ORDER — BELLADONNA ALKALOIDS-OPIUM 16.2-30 MG RE SUPP
RECTAL | Status: AC
  Filled 2017-11-24: qty 1

## 2017-11-24 MED ORDER — KETOROLAC TROMETHAMINE 30 MG/ML IJ SOLN
INTRAMUSCULAR | Status: DC | PRN
  Administered 2017-11-24: 30 mg via INTRAVENOUS

## 2017-11-24 MED ORDER — OXYCODONE HCL 5 MG/5ML PO SOLN
5.0000 mg | Freq: Once | ORAL | Status: AC | PRN
  Filled 2017-11-24: qty 5

## 2017-11-24 MED ORDER — OXYCODONE-ACETAMINOPHEN 5-325 MG PO TABS: 1.0000 | ORAL_TABLET | Freq: Four times a day (QID) | ORAL | 0 refills | Status: AC | PRN

## 2017-11-24 MED ORDER — KETOROLAC TROMETHAMINE 30 MG/ML IJ SOLN
INTRAMUSCULAR | Status: AC
  Filled 2017-11-24: qty 1

## 2017-11-24 MED ORDER — OXYCODONE HCL 5 MG PO TABS: 5.0000 mg | ORAL_TABLET | ORAL | Status: DC | PRN

## 2017-11-24 SURGICAL SUPPLY — 25 items
BAG URO CATCHER STRL LF (MISCELLANEOUS) ×3 IMPLANT
BASKET STONE NCOMPASS (UROLOGICAL SUPPLIES) IMPLANT
CATH URET 5FR 28IN OPEN ENDED (CATHETERS) ×3 IMPLANT
CATH URET DUAL LUMEN 6-10FR 50 (CATHETERS) IMPLANT
CLOTH BEACON ORANGE TIMEOUT ST (SAFETY) ×3 IMPLANT
COVER FOOTSWITCH UNIV (MISCELLANEOUS) IMPLANT
COVER SURGICAL LIGHT HANDLE (MISCELLANEOUS) ×3 IMPLANT
EXTRACTOR STONE NITINOL NGAGE (UROLOGICAL SUPPLIES) ×3 IMPLANT
FIBER LASER FLEXIVA 1000 (UROLOGICAL SUPPLIES) IMPLANT
FIBER LASER FLEXIVA 365 (UROLOGICAL SUPPLIES) IMPLANT
FIBER LASER FLEXIVA 550 (UROLOGICAL SUPPLIES) IMPLANT
FIBER LASER TRAC TIP (UROLOGICAL SUPPLIES) IMPLANT
GLOVE SURG SS PI 8.0 STRL IVOR (GLOVE) ×3 IMPLANT
GOWN STRL REUS W/TWL XL LVL3 (GOWN DISPOSABLE) ×3 IMPLANT
GUIDEWIRE STR DUAL SENSOR (WIRE) ×6 IMPLANT
IV NS 1000ML (IV SOLUTION)
IV NS 1000ML BAXH (IV SOLUTION) IMPLANT
IV NS IRRIG 3000ML ARTHROMATIC (IV SOLUTION) IMPLANT
MANIFOLD NEPTUNE II (INSTRUMENTS) ×3 IMPLANT
PACK CYSTO (CUSTOM PROCEDURE TRAY) ×3 IMPLANT
SHEATH URETERAL 12FRX35CM (MISCELLANEOUS) ×3 IMPLANT
STENT URET 6FRX24 CONTOUR (STENTS) ×6 IMPLANT
TUBING CONNECTING 10 (TUBING) ×2 IMPLANT
TUBING CONNECTING 10' (TUBING) ×1
TUBING UROLOGY SET (TUBING) ×3 IMPLANT

## 2017-11-24 NOTE — Anesthesia Procedure Notes (Signed)
Procedure Name: LMA Insertion Date/Time: 11/24/2017 7:27 AM Performed by: Vanessa Durhamochran, Banks Chaikin Glenn, CRNA Pre-anesthesia Checklist: Emergency Drugs available, Patient identified, Suction available and Patient being monitored Patient Re-evaluated:Patient Re-evaluated prior to induction Oxygen Delivery Method: Circle system utilized Preoxygenation: Pre-oxygenation with 100% oxygen Induction Type: IV induction Ventilation: Mask ventilation without difficulty LMA: LMA inserted LMA Size: 4.0 Number of attempts: 1 Placement Confirmation: positive ETCO2 and breath sounds checked- equal and bilateral Tube secured with: Tape Dental Injury: Teeth and Oropharynx as per pre-operative assessment

## 2017-11-24 NOTE — Discharge Instructions (Signed)
Ureteral Stent Implantation, Care After Refer to this sheet in the next few weeks. These instructions provide you with information about caring for yourself after your procedure. Your health care provider may also give you more specific instructions. Your treatment has been planned according to current medical practices, but problems sometimes occur. Call your health care provider if you have any problems or questions after your procedure. What can I expect after the procedure? After the procedure, it is common to have:  Nausea.  Mild pain when you urinate. You may feel this pain in your lower back or lower abdomen. Pain should stop within a few minutes after you urinate. This may last for up to 1 week.  A small amount of blood in your urine for several days.  Follow these instructions at home:  Medicines  Take over-the-counter and prescription medicines only as told by your health care provider.  If you were prescribed an antibiotic medicine, take it as told by your health care provider. Do not stop taking the antibiotic even if you start to feel better.  Do not drive for 24 hours if you received a sedative.  Do not drive or operate heavy machinery while taking prescription pain medicines. Activity  Return to your normal activities as told by your health care provider. Ask your health care provider what activities are safe for you.  Do not lift anything that is heavier than 10 lb (4.5 kg). Follow this limit for 1 week after your procedure, or for as long as told by your health care provider. General instructions  Watch for any blood in your urine. Call your health care provider if the amount of blood in your urine increases.  If you have a catheter: ? Follow instructions from your health care provider about taking care of your catheter and collection bag. ? Do not take baths, swim, or use a hot tub until your health care provider approves.  Drink enough fluid to keep your urine  clear or pale yellow.  Keep all follow-up visits as told by your health care provider. This is important. Contact a health care provider if:  You have pain that gets worse or does not get better with medicine, especially pain when you urinate.  You have difficulty urinating.  You feel nauseous or you vomit repeatedly during a period of more than 2 days after the procedure. Get help right away if:  Your urine is dark red or has blood clots in it.  You are leaking urine (have incontinence).  The end of the stent comes out of your urethra.  You cannot urinate.  You have sudden, sharp, or severe pain in your abdomen or lower back.  You have a fever.  You may remove the ureteral stents on Friday morning by pulling the attached strings and if you don't feel comfortable doing that please contact the office to come in and have them removed.  Bring your stone to the office for analysis.  I am going to start you on a medicine called potassium citrate to help reduce stone formation.  It is 1 3 times daily.  Side effects can include intestinal upset and an elevated potassium.   This information is not intended to replace advice given to you by your health care provider. Make sure you discuss any questions you have with your health care provider. Document Released: 12/22/2012 Document Revised: 09/27/2015 Document Reviewed: 11/03/2014 Elsevier Interactive Patient Education  [employer]2018 [employer]

## 2017-11-24 NOTE — Op Note (Signed)
Marland Kitchen:Procedure: 1.  Cystoscopy with removal and replacement of bilateral double-J stents. 2.  Left ureteroscopy with stone extraction. 3.  Right ureteroscopy with stone extraction.  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  Postop diagnosis: Bilateral small distal ureteral stones with distal strictures and bilateral Randall's plaques.  Surgeon: Dr. Bjorn PippinJohn Nevada Mullett.  Anesthesia: General.  Specimen: Stone.  Drains: Bilateral 6 French by 24 cm contour double-J stent with tether's.  EBL: Minimal.  Complications: None.  Indications: Jorja Loaim is a 27 year old white male who presented initially with bilateral 1 to 2 mm distal ureteral stones with pain and obstruction.  He was also noted to have small renal calculi on CT.  He initially underwent cystoscopy with bilateral ureteroscopy but was found to have tight stricturing of both distal ureters just above the meatus with the stones wedged above the stricture.  I was unable to dilate sufficiently to retrieve the stone on the right and on the left I was unsure as to whether or not I was able to retrieve the stone despite getting the ureteroscope above the stricture.  He was left with bilateral 4.8 [place] contour double-J stent and returns today for ureteroscopy.  Procedure: He was given 2 g of Ancef and was taken operating room where a general anesthetic was induced.  He was placed in lithotomy position and fitted with PAS hose.  His perineum and genitalia were prepped with Betadine solution was draped in usual sterile fashion.  Cystoscopy was performed using the 23 [place] scope and 30 degree lens.  Examination revealed a normal urethra.  The external sphincter was intact.  The prostatic urethra short with minimal hyperplasia.  Examination of bladder revealed a smooth wall.  There were stents of both ureteral orifices with some encrustation and stent edema around the orifices.  The left stent  loop was grasped with a grasping forceps and pulled the urethral meatus.  A sensor guidewire was then passed through the stent to the kidney and the stent was removed.  He was noted to have some encrustation along the distal loop and the proximal end of the stent despite the stent only being there 2 weeks.    Once the wire was in place the cystoscope was removed and the 6.5 French semirigid ureteroscope was passed alongside the wire.  I was able to get it approximately 3 cm up the ureter but not beyond because residual narrowing.  Ureteroscope was removed and a 12 French introducer sheath inner core was used to dilate the ureter above the stricture.  I then passed the 6-1/2 [place] scope up to the level of the mid ureter I did not see any obvious stones.  There was some debris and sand from the stent encrustation and some of that was right out using an engage basket.  I then attempted to pass a 35 cm 12/14 assembled access sheath but could not get it above the meatus.  The single lumen flexible digital ureteroscope was then passed alongside the wire and successfully negotiated up the proximal ureter to the kidney.  No sizable stones were noted however there was some debris in sand from the stent encrustation it rubbed after removal.  Inspection of the kidney revealed no retrievable stones in the calyces but Randall's plaques.  Ureteroscope was then removed the wire was left in place at this time.  I then retrieved the right ureteral stent to the meatus and passed the wire to the kidney.  The  stent was removed and the 6-1/2 French ureteroscope was then passed.  I was able to negotiate this through the distal ureter into the mid ureter but was unable to identify any retrievable stones.  I then passed the single lumen flexible digital ureteroscope up the proximal ureter to the kidney once again I found some debris from the encrusted stent and Randall's plaques but no significant retrievable stones.  After  removal of the ureteroscope the cystoscope was reinserted over the wire first on the right and then on the left and bilateral 6 [place] by 24 cm contour double-J stent with tether were passed to the kidneys under fluoroscopic guidance.  Upon removal of the wire good curls were noted in the kidneys on each side and a good pulse in the bladder.  The bladder was then evacuated of debris and a single small 1 mm stone was retrieved.  The stone was consistent with what is been seen on the CT scan and on the initial ureteroscopy.  It was difficult to say which side this came from.  The bladder was then partially drained and the cystoscope was removed.  The stent strings were secured to the penis and the urethra was instilled with 10 cc of 2% lidocaine jelly.  A B&O suppository was placed.  Patient was taken down from lithotomy position, his anesthetic was reversed and he was moved to recovery in stable condition.  There were no complications.

## 2017-11-24 NOTE — Anesthesia Preprocedure Evaluation (Signed)
Anesthesia Evaluation  Patient identified by MRN, date of birth, ID band Patient awake    Reviewed: Allergy & Precautions, NPO status , Patient's Chart, lab work & pertinent test results  Airway Mallampati: II  TM Distance: >3 FB Neck ROM: Full    Dental no notable dental hx.    Pulmonary Current Smoker,    Pulmonary exam normal breath sounds clear to auscultation       Cardiovascular negative cardio ROS Normal cardiovascular exam Rhythm:Regular Rate:Normal     Neuro/Psych negative neurological ROS  negative psych ROS   GI/Hepatic negative GI ROS, Neg liver ROS,   Endo/Other  negative endocrine ROS  Renal/GU negative Renal ROS  negative genitourinary   Musculoskeletal negative musculoskeletal ROS (+)   Abdominal   Peds negative pediatric ROS (+)  Hematology negative hematology ROS (+)   Anesthesia Other Findings   Reproductive/Obstetrics negative OB ROS                             Anesthesia Physical Anesthesia Plan  ASA: II  Anesthesia Plan: General   Post-op Pain Management:    Induction: Intravenous  PONV Risk Score and Plan: 2 and Ondansetron, Dexamethasone and Treatment may vary due to age or medical condition  Airway Management Planned: LMA  Additional Equipment:   Intra-op Plan:   Post-operative Plan: Extubation in OR  Informed Consent: I have reviewed the patients History and Physical, chart, labs and discussed the procedure including the risks, benefits and alternatives for the proposed anesthesia with the patient or authorized representative who has indicated his/her understanding and acceptance.   Dental advisory given  Plan Discussed with: CRNA and Surgeon  Anesthesia Plan Comments:         Anesthesia Quick Evaluation  

## 2017-11-24 NOTE — Transfer of Care (Signed)
Immediate Anesthesia Transfer of Care Note  Patient: Marvin Henry  Procedure(s) Performed: CYSTOSCOPY WITH BILATERAL  URETEROSCOPY AND STENT EXCHANGE (Bilateral )  Patient Location: PACU  Anesthesia Type:General  Level of Consciousness: drowsy and patient cooperative  Airway & Oxygen Therapy: Patient Spontanous Breathing and Patient connected to face mask  Post-op Assessment: Report given to RN and Post -op Vital signs reviewed and stable  Post vital signs: Reviewed and stable  Last Vitals:  Vitals Value Taken Time  BP    Temp    Pulse    Resp    SpO2      Last Pain:  Vitals:   11/24/17 0552  TempSrc: Oral      Patients Stated Pain Goal: 3 (11/24/17 [id number])  Complications: No apparent anesthesia complications

## 2017-11-24 NOTE — Interval H&P Note (Signed)
History and Physical Interval Note:  He had bilateral ureteral strictures and was stented.  He will have ureteroscopy today.  11/24/2017 7:08 AM  Marvin Henry  has presented today for surgery, with the diagnosis of BILATERAL DISTAL STONES WITH URETERAL STRICTURES  The various methods of treatment have been discussed with the patient and family. After consideration of risks, benefits and other options for treatment, the patient has consented to  Procedure(s): CYSTOSCOPY WITH BILATERAL  URETEROSCOPY POSSIBLE LASER AND STENT EXCHANGE (Bilateral) as a surgical intervention .  The patient's history has been reviewed, patient examined, no change in status, stable for surgery.  I have reviewed the patient's chart and labs.  Questions were answered to the patient's satisfaction.     Bjorn PippinJohn Debara Kamphuis

## 2017-11-24 NOTE — Anesthesia Postprocedure Evaluation (Signed)
Anesthesia Post Note  Patient: Paulo Fruitimothy Wisinski  Procedure(s) Performed: CYSTOSCOPY WITH BILATERAL  URETEROSCOPY AND STENT EXCHANGE (Bilateral )     Patient location during evaluation: PACU Anesthesia Type: General Level of consciousness: awake and alert Pain management: pain level controlled Vital Signs Assessment: post-procedure vital signs reviewed and stable Respiratory status: spontaneous breathing, nonlabored ventilation, respiratory function stable and patient connected to nasal cannula oxygen Cardiovascular status: blood pressure returned to baseline and stable Postop Assessment: no apparent nausea or vomiting Anesthetic complications: no    Last Vitals:  Vitals:   11/24/17 0840 11/24/17 0900  BP: 122/76 115/67  Pulse: 88 80  Resp: 18 14  Temp: (!) 36.4 C   SpO2: 100% 100%    Last Pain:  Vitals:   11/24/17 0900  TempSrc:   PainSc: Asleep                 Gian Ybarra S

## 2017-11-25 ENCOUNTER — Encounter (HOSPITAL_COMMUNITY): Payer: Self-pay | Admitting: Urology

## 2020-06-23 IMAGING — CT CT RENAL STONE PROTOCOL
2 of 4 series · 16 of 46 positions shown, 18 images · non-contrast
Comparison: None.

CLINICAL DATA: 27-year-old male with acute LEFT flank and abdominal
pain today.

EXAM:
CT ABDOMEN AND PELVIS WITHOUT CONTRAST
TECHNIQUE: Multidetector CT imaging of the abdomen and pelvis was performed
following the standard protocol without IV contrast.

[Series 3: stone study 5.0 i30f 2 · axial · 0.66mm/px · z∈[+892,+1247]mm · 13 of 79 slices shown, 15 images]
[im 4/79  soft-tissue]
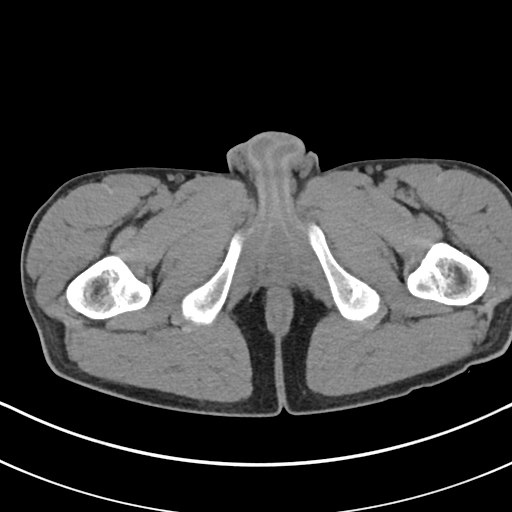
[im 4/79  bone]
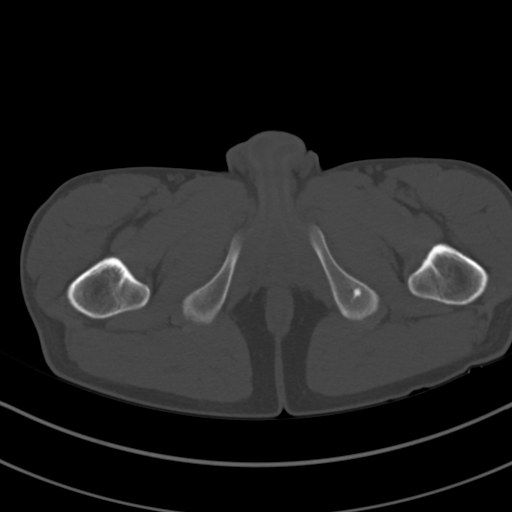
[im 10/79  soft-tissue]
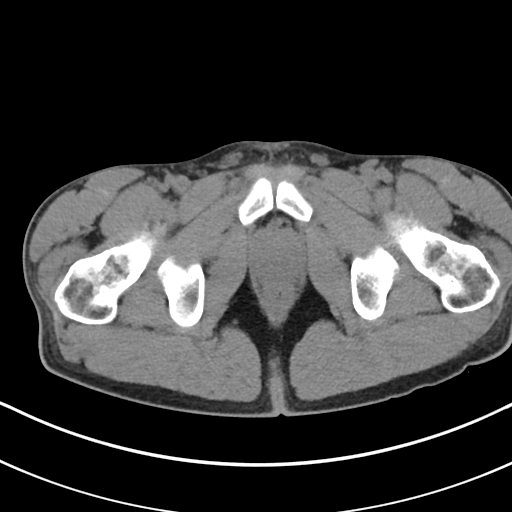
[im 17/79  soft-tissue]
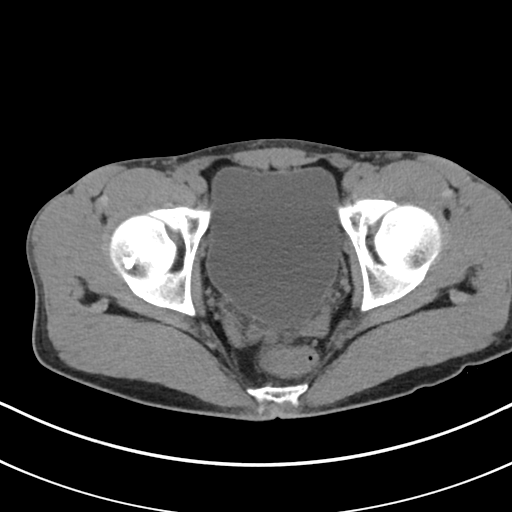
[im 23/79  soft-tissue]
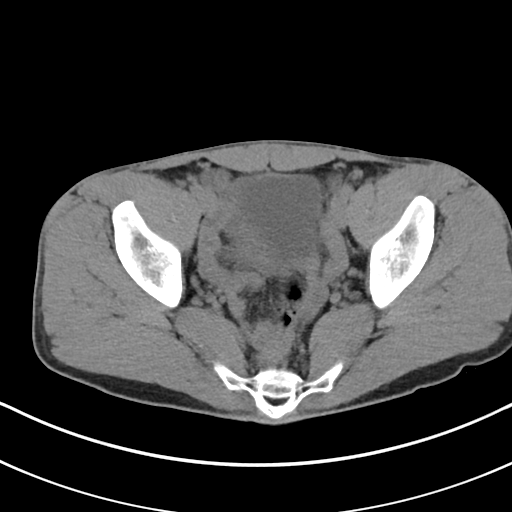
[im 27/79  soft-tissue]
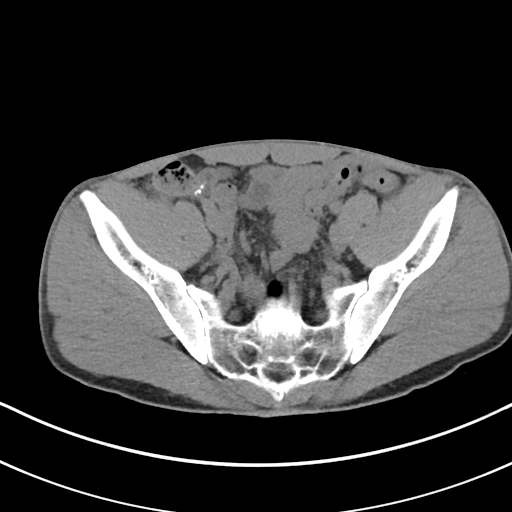
[im 33/79  soft-tissue]
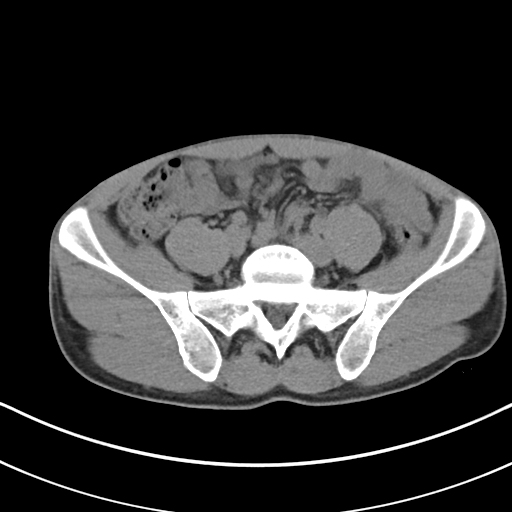
[im 40/79  soft-tissue]
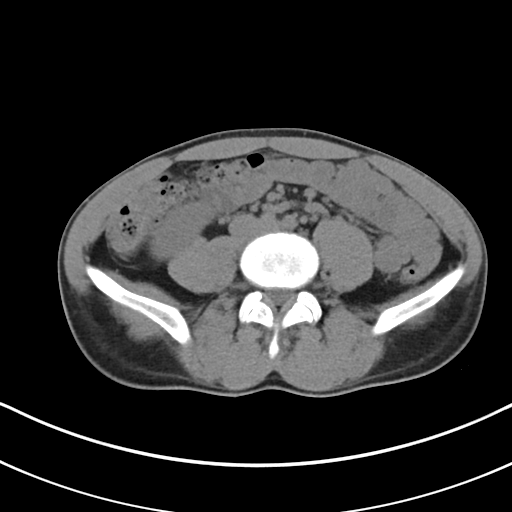
[im 46/79  soft-tissue]
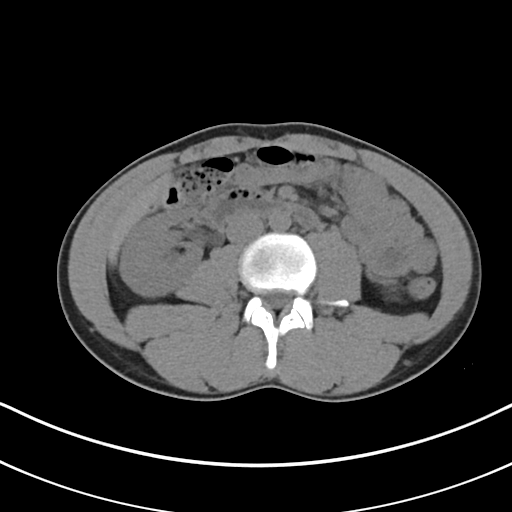
[im 53/79  soft-tissue]
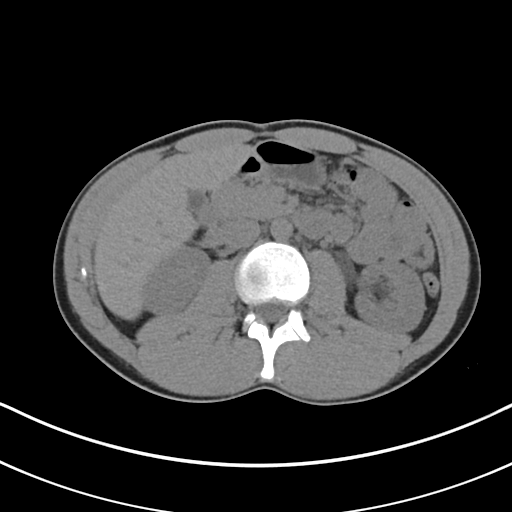
[im 53/79  bone]
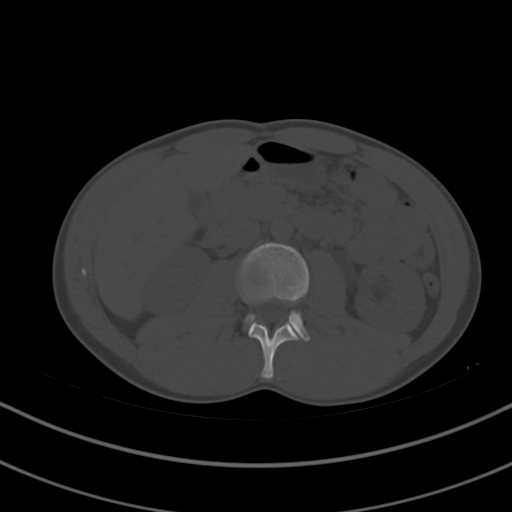
[im 56/79  soft-tissue]
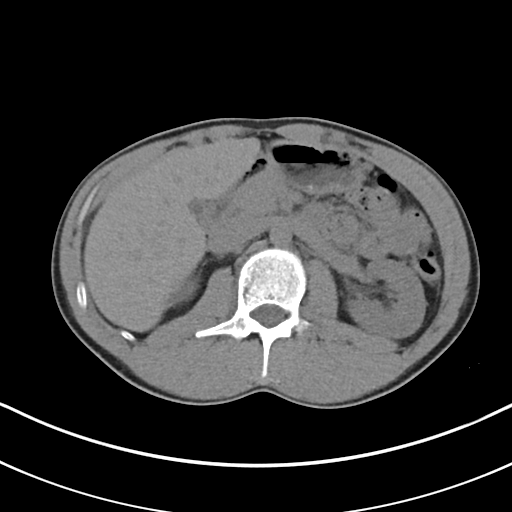
[im 62/79  soft-tissue]
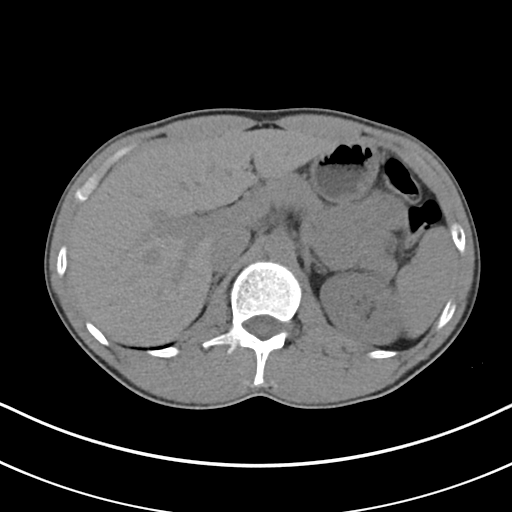
[im 69/79  soft-tissue]
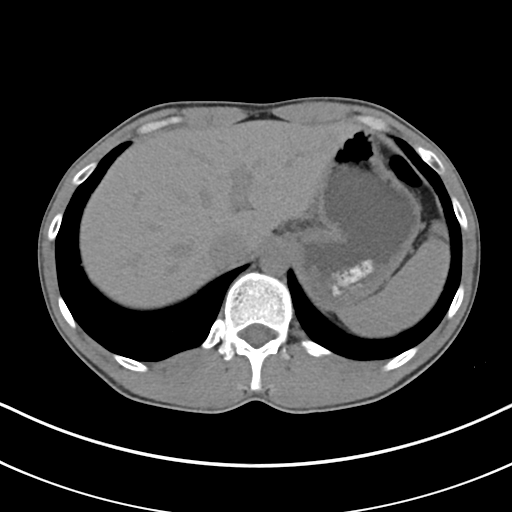
[im 75/79  soft-tissue]
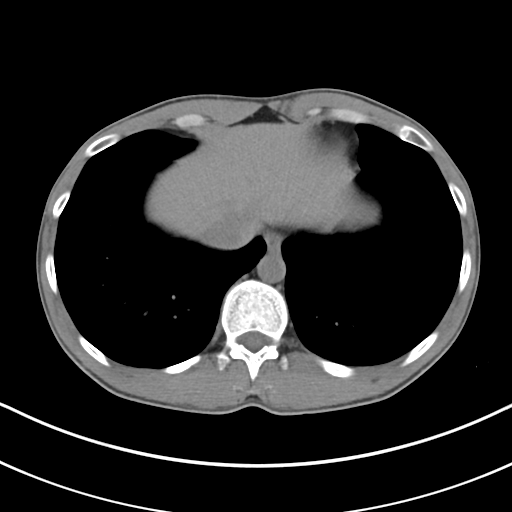

[Series 7: coronal soft tissue · coronal · 0.61mm/px · 3 of 79 slices shown]
[im 27/79  soft-tissue]
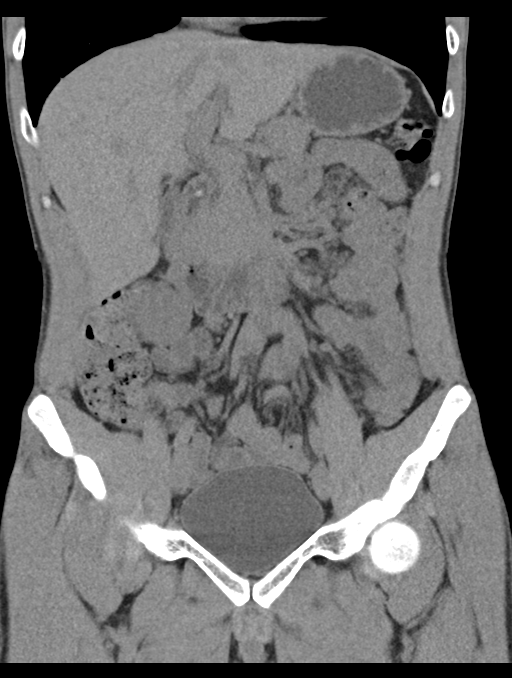
[im 35/79  soft-tissue]
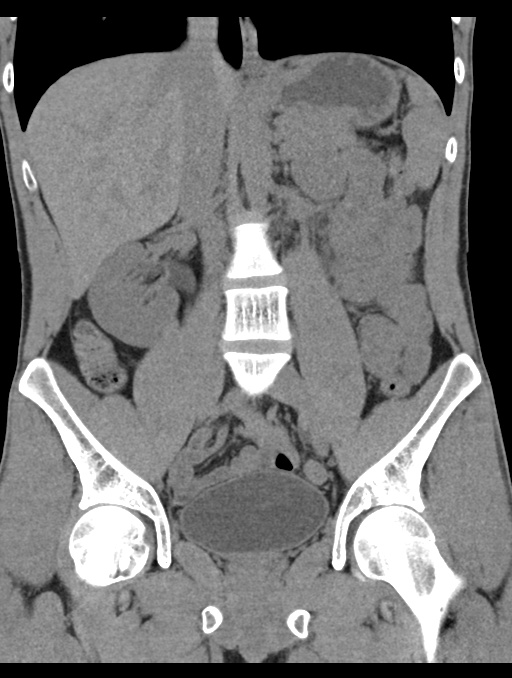
[im 44/79  soft-tissue]
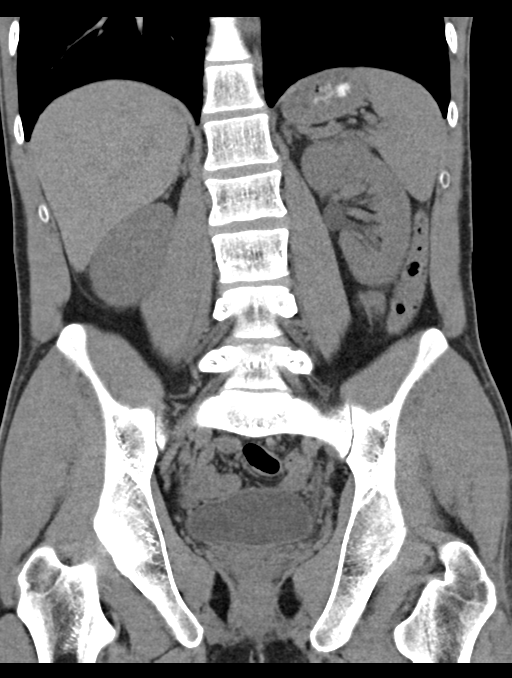

[16 of 46 positions shown; findings below may reference images not displayed]

FINDINGS: Please note that parenchymal abnormalities may be missed without
intravenous contrast.

Lower chest: No acute abnormality.

Hepatobiliary: The liver and gallbladder are unremarkable. No
biliary dilatation.

Pancreas: Unremarkable

Spleen: Unremarkable

Adrenals/Urinary Tract: A punctate distal LEFT ureteral calculus (5
mm above the LEFT UVJ) is noted. No hydronephrosis identified.

Punctate nonobstructing calculi within the UPPER RIGHT kidney and
UPPER LEFT kidney noted.

The adrenal glands and bladder are unremarkable.

Stomach/Bowel: Stomach is within normal limits. No evidence of bowel
wall thickening, distention, or inflammatory changes.

Vascular/Lymphatic: No significant vascular findings are present. No
enlarged abdominal or pelvic lymph nodes.

Reproductive: Prostate is unremarkable.

Other: No ascites, focal collection or pneumoperitoneum.

Musculoskeletal: No acute or significant osseous findings.
IMPRESSION: 1. Punctate distal LEFT ureteral calculus (just above the LEFT UVJ)
without hydronephrosis.
2. Punctate nonobstructing bilateral renal calculi
